# Patient Record
Sex: Female | Born: 1970 | Race: White | Hispanic: No | Marital: Married | Smoking: Never smoker
Health system: Southern US, Academic
[De-identification: ages and names within clinical notes are randomized; demographics above are authoritative.]

## PROBLEM LIST (undated history)

## (undated) DIAGNOSIS — K219 Gastro-esophageal reflux disease without esophagitis: Secondary | ICD-10-CM

## (undated) DIAGNOSIS — R51 Headache: Secondary | ICD-10-CM

## (undated) DIAGNOSIS — R05 Cough: Secondary | ICD-10-CM

## (undated) DIAGNOSIS — F410 Panic disorder [episodic paroxysmal anxiety] without agoraphobia: Secondary | ICD-10-CM

## (undated) DIAGNOSIS — R059 Cough, unspecified: Secondary | ICD-10-CM

## (undated) DIAGNOSIS — R519 Headache, unspecified: Secondary | ICD-10-CM

## (undated) DIAGNOSIS — R062 Wheezing: Secondary | ICD-10-CM

## (undated) DIAGNOSIS — F329 Major depressive disorder, single episode, unspecified: Secondary | ICD-10-CM

## (undated) DIAGNOSIS — F32A Depression, unspecified: Secondary | ICD-10-CM

## (undated) DIAGNOSIS — R002 Palpitations: Secondary | ICD-10-CM

## (undated) HISTORY — PX: HX HYSTERECTOMY: SHX81

## (undated) HISTORY — PX: NO PAST SURGERIES: SHX2092

---

## 2011-04-02 ENCOUNTER — Ambulatory Visit: Payer: Self-pay | Admitting: Internal Medicine

## 2014-05-21 ENCOUNTER — Ambulatory Visit: Payer: Self-pay | Admitting: Obstetrics and Gynecology

## 2014-11-08 ENCOUNTER — Ambulatory Visit: Payer: Self-pay

## 2015-04-08 ENCOUNTER — Ambulatory Visit
Admit: 2015-04-08 | Disposition: A | Discharge status: Home / Self Care | Payer: Self-pay | Attending: Family Medicine | Admitting: Family Medicine

## 2015-10-08 ENCOUNTER — Other Ambulatory Visit: Payer: Self-pay | Admitting: Obstetrics and Gynecology

## 2015-10-08 DIAGNOSIS — Z1231 Encounter for screening mammogram for malignant neoplasm of breast: Secondary | ICD-10-CM

## 2015-12-17 ENCOUNTER — Ambulatory Visit
Admission: RE | Admit: 2015-12-17 | Discharge: 2015-12-17 | Disposition: A | Discharge status: Home / Self Care | Payer: No Typology Code available for payment source | Source: Ambulatory Visit | Attending: Obstetrics and Gynecology | Admitting: Obstetrics and Gynecology

## 2015-12-17 DIAGNOSIS — Z1231 Encounter for screening mammogram for malignant neoplasm of breast: Secondary | ICD-10-CM | POA: Diagnosis present

## 2016-11-25 ENCOUNTER — Other Ambulatory Visit: Payer: Self-pay | Admitting: Family Medicine

## 2018-01-12 ENCOUNTER — Other Ambulatory Visit: Payer: Self-pay | Admitting: Family Medicine

## 2018-01-18 ENCOUNTER — Other Ambulatory Visit: Payer: Self-pay | Admitting: Family Medicine

## 2018-01-18 DIAGNOSIS — Z1239 Encounter for other screening for malignant neoplasm of breast: Secondary | ICD-10-CM

## 2018-02-17 ENCOUNTER — Ambulatory Visit: Payer: No Typology Code available for payment source

## 2018-02-21 ENCOUNTER — Ambulatory Visit
Admission: RE | Admit: 2018-02-21 | Discharge: 2018-02-21 | Disposition: A | Discharge status: Home / Self Care | Payer: 59 | Source: Ambulatory Visit | Attending: Family Medicine | Admitting: Family Medicine

## 2018-02-21 DIAGNOSIS — Z1231 Encounter for screening mammogram for malignant neoplasm of breast: Secondary | ICD-10-CM | POA: Insufficient documentation

## 2018-02-21 DIAGNOSIS — Z1239 Encounter for other screening for malignant neoplasm of breast: Secondary | ICD-10-CM

## 2018-04-28 ENCOUNTER — Other Ambulatory Visit: Payer: Self-pay

## 2018-04-28 ENCOUNTER — Encounter
Admission: RE | Admit: 2018-04-28 | Discharge: 2018-04-28 | Disposition: A | Discharge status: Home / Self Care | Payer: 59 | Source: Ambulatory Visit | Attending: Obstetrics and Gynecology | Admitting: Obstetrics and Gynecology

## 2018-04-28 DIAGNOSIS — R002 Palpitations: Secondary | ICD-10-CM | POA: Diagnosis not present

## 2018-04-28 DIAGNOSIS — Z01810 Encounter for preprocedural cardiovascular examination: Secondary | ICD-10-CM | POA: Insufficient documentation

## 2018-04-28 DIAGNOSIS — Z01812 Encounter for preprocedural laboratory examination: Secondary | ICD-10-CM | POA: Insufficient documentation

## 2018-04-28 HISTORY — DX: Palpitations: R00.2

## 2018-04-28 HISTORY — DX: Cough, unspecified: R05.9

## 2018-04-28 HISTORY — DX: Headache: R51

## 2018-04-28 HISTORY — DX: Depression, unspecified: F32.A

## 2018-04-28 HISTORY — DX: Major depressive disorder, single episode, unspecified: F32.9

## 2018-04-28 HISTORY — DX: Cough: R05

## 2018-04-28 HISTORY — DX: Headache, unspecified: R51.9

## 2018-04-28 HISTORY — DX: Gastro-esophageal reflux disease without esophagitis: K21.9

## 2018-04-28 HISTORY — DX: Panic disorder (episodic paroxysmal anxiety): F41.0

## 2018-04-28 HISTORY — DX: Wheezing: R06.2

## 2018-04-28 LAB — BASIC METABOLIC PANEL
ANION GAP: 7 (ref 5–15)
BUN: 13 mg/dL (ref 6–20)
CHLORIDE: 104 mmol/L (ref 101–111)
CO2: 24 mmol/L (ref 22–32)
CREATININE: 0.88 mg/dL (ref 0.44–1.00)
Calcium: 8.9 mg/dL (ref 8.9–10.3)
GFR calc non Af Amer: >60 mL/min (ref >60)
Glucose, Bld: 98 mg/dL (ref 65–99)
Potassium: 4.1 mmol/L (ref 3.5–5.1)
SODIUM: 135 mmol/L (ref 135–145)

## 2018-04-28 LAB — CBC
HEMATOCRIT: 40.3 % (ref 35.0–47.0)
HEMOGLOBIN: 13.6 g/dL (ref 12.0–16.0)
MCH: 30.3 pg (ref 26.0–34.0)
MCHC: 33.8 g/dL (ref 32.0–36.0)
MCV: 89.9 fL (ref 80.0–100.0)
Platelets: 342 10*3/uL (ref 150–440)
RBC: 4.48 MIL/uL (ref 3.80–5.20)
RDW: 13.3 % (ref 11.5–14.5)
WBC: 5.5 10*3/uL (ref 3.6–11.0)

## 2018-04-28 LAB — TYPE AND SCREEN
ABO/RH(D): O POS
Antibody Screen: NEGATIVE

## 2018-04-28 NOTE — Patient Instructions (Signed)
Your procedure is scheduled on: 05/06/18 Report to Day Surgery. [facility]OND [facility]LOOR To find out your arrival time please call [phone] between 1PM - 3PM on  05/05/18.  Remember: Instructions that are not followed completely may result in serious medical risk, up to and including death, or upon the discretion of your surgeon and anesthesiologist your surgery may need to be rescheduled.     _X__ 1. Do not eat food after midnight the night before your procedure.                 No gum chewing or hard candies. You may drink clear liquids up to 2 hours                 before you are scheduled to arrive for your surgery- DO not drink clear                 liquids within 2 hours of the start of your surgery.                 Clear Liquids include:  water, apple juice without pulp, clear carbohydrate                 drink such as Clearfast of Gartorade, Black Coffee or Tea (Do not add                 anything to coffee or tea).  __X__2.  On the morning of surgery brush your teeth with toothpaste and water, you                 may rinse your mouth with mouthwash if you wish.  Do not swallow any              toothpaste of mouthwash.     _X__ 3.  No Alcohol for 24 hours before or after surgery.   _X__ 4.  Do Not Smoke or use e-cigarettes For 24 Hours Prior to Your Surgery.                 Do not use any chewable tobacco products for at least 6 hours prior to                 surgery.  ____  5.  Bring all medications with you on the day of surgery if instructed.   _X__  6.  Notify your doctor if there is any change in your medical condition      (cold, fever, infections).     Do not wear jewelry, make-up, hairpins, clips or nail polish. Do not wear lotions, powders, or perfumes. You may wear deodorant. Do not shave 48 hours prior to surgery. Men may shave face and neck. Do not bring valuables to the hospital.    [facility] is not responsible for any belongings or  valuables.  Contacts, dentures or bridgework may not be worn into surgery. Leave your suitcase in the car. After surgery it may be brought to your room. For patients admitted to the hospital, discharge time is determined by your treatment team.   Patients discharged the day of surgery will not be allowed to drive home.   Please read over the following fact sheets that you were given:   Surgical Site Infection Prevention   ____ Take these medicines the morning of surgery with A SIP OF WATER:    1. NONE  2.   3.   4.  5.  6.  ____  Fleet Enema (as directed)   _X___ Use CHG Soap as directed  ____ Use inhalers on the day of surgery  ____ Stop metformin 2 days prior to surgery    ____ Take 1/2 of usual insulin dose the night before surgery. No insulin the morning          of surgery.   ____ Stop Coumadin/Plavix/aspirin on   ____ Stop Anti-inflammatories on    ____ Stop supplements until after surgery.    ____ Bring C-Pap to the hospital.

## 2018-04-28 NOTE — Pre-Procedure Instructions (Signed)
NONPRODUCTIVE COUGH /WHEEZING X 5 DAYS. ASKED HER TO BE SEEN AT AN URGENT CARE IF NO PCP AND DR BEASLEY DID NOT GIVE INHALER. PATIENT UNDERSTOOD SHE MIFHT

## 2018-04-28 NOTE — H&P (Signed)
Patient ID: Mercedes Rollins is a 47 y.o. female presenting with Pre Op Consulting  on 03/30/2018  HPI:  Surgical consult for 10cm fibroids with heavy periods and back pain. Urinary hesitation occasionally.  Hx of LEEP in the remote past  Nulliparous, interested in hysterectomy  Last pap smear 2019 neg with neg HPV Pap 2015 neg with neg HPV  - Hx of HTN - Hx of Body mass index is 37.84 kg/m.  Past Medical History:  has a past medical history of Chickenpox, Depression, unspecified, Fibroid, and GERD (gastroesophageal reflux disease).  Past Surgical History:  has a past surgical history that includes Cervical biopsy w/ loop electrode excision and photorefractive keratotomy/lasik (Bilateral). Family History: family history includes Alcohol abuse in her father; Breast cancer in her maternal grandmother; Depression in her mother; High blood pressure (Hypertension) in her mother; Obesity in her mother. Social History:  reports that she has never smoked. She has never used smokeless tobacco. She reports that she drinks about 6.0 oz of alcohol per week. She reports that she does not use drugs. OB/GYN History:          OB History    Gravida  0   Para  0   Term  0   Preterm  0   AB  0   Living  0     SAB  0   TAB  0   Ectopic  0   Molar      Multiple  0   Live Births             Allergies: has No Known Allergies. Medications:  Current Outpatient Medications:  .  ASCORBATE CALCIUM (VITAMIN C ORAL), Take by mouth., Disp: , Rfl:  .  betamethasone dipropionate (DIPROLENE) 0.05 % cream, Apply topically 2 (two) times daily., Disp: 45 g, Rfl: 1 .  calcium carbonate-vitamin D3 (CALTRATE 600+D) 600 mg(1,500mg ) -200 unit tablet, Take 1 tablet by mouth 2 (two) times daily with meals., Disp: , Rfl:  .  CINNAMON BARK (CINNAMON ORAL), Take by mouth., Disp: , Rfl:  .  CRANBERRY EXTRACT (CRANBERRY ORAL), Take by mouth., Disp: , Rfl:  .  cyanocobalamin (VITAMIN  B12) 1000 MCG tablet, Take 1,000 mcg by mouth once daily., Disp: , Rfl:  .  ketoconazole (NIZORAL) 2 % cream, Apply topically 2 (two) times daily. Apply under skin folds. (Patient not taking: Reported on 03/16/2018 ), Disp: 15 g, Rfl: 0 .  multivitamin tablet, Take 1 tablet by mouth once daily., Disp: , Rfl:  .  omega-3 fatty acids/fish oil 340-1,000 mg capsule, Take 1 capsule by mouth 2 (two) times daily., Disp: , Rfl:  .  predniSONE (DELTASONE) 10 MG tablet, 4 tablets on days 1-2, 2 tablets on days 3-4, 1 tablet on days 5-6 (Patient not taking: Reported on 03/16/2018 ), Disp: 14 tablet, Rfl: 0 .  sertraline (ZOLOFT) 100 MG tablet, Take 1.5 tablets (150 mg total) by mouth once daily, Disp: 45 tablet, Rfl: 11 .  SUMAtriptan (IMITREX) 100 MG tablet, Take 1/2 to 1 tablet at headache onset. May take a second dose after 2 hours if needed., Disp: 9 tablet, Rfl: 3 .  triamcinolone 0.1 % ointment, Apply topically 2 (two) times daily. For irritated/itchy skin. (Patient not taking: Reported on 03/16/2018 ), Disp: 30 g, Rfl: 0 .  turmeric root extract 538 mg Cap, Take by mouth., Disp: , Rfl:  .  VITAMIN B COMPLEX (B COMPLEX 1 ORAL), Take by mouth., Disp: , Rfl:  Review of Systems: No SOB, no palpitations or chest pain, no new lower extremity edema, no nausea or vomiting or bowel or bladder complaints. See HPI for gyn specific ROS.   Exam:   BP (!) 158/94   Ht 160 cm (5\' 3" )   Wt 96.3 kg (212 lb 3.2 oz)   LMP 03/23/2018 (Exact Date)   BMI 37.59 kg/m   General: Patient is well-groomed, well-nourished, appears stated age in no acute distress  HEENT: head is atraumatic and normocephalic, trachea is midline, neck is supple with no palpable nodules  CV: Regular rhythm and normal heart rate, no murmur  Pulm: Clear to auscultation throughout lung fields with no wheezing, crackles, or rhonchi. No increased work of breathing  Abdomen: soft , no mass, non-tender, no rebound tenderness, no  hepatomegaly  Pelvic: tanner stage 5 ,              External genitalia: vulva /labia no lesions             Urethra: no prolapse             Vagina: normal physiologic d/c, laxity in vaginal walls             Cervix: no lesions, no cervical motion tenderness, good descent             Uterus: 16wk sized, both broad and long, regular contour, non-tender             Adnexa: no mass,  non-tender               Rectovaginal: External wnl     Endometrial biopsy: The cervix was cleaned with betadine, topical Hurriciane spray applied, and a single tooth tenaculum is applied to the anterior cervix. The Pipelle catheter was placed into the endometrial cavity. It sounds to 9 cm and adequate tissue was removed.     Impression:   The primary encounter diagnosis was Intramural leiomyoma of uterus. A diagnosis of Excessive or frequent menstruation was also pertinent to this visit.    Plan:   Patient returns for a preoperative discussion regarding her plans to proceed with definitive surgical treatment of her fibroids by total laproscopic hysterectomy with bilateral salpingectomy and cystoscopy.  The patient and I discussed the technical aspects of the procedure including the potential for risks and complications. These include but are not limited to the risk of infection requiring post-operative antibiotics or further procedures. We talked about the risk of injury to adjacent organs including bladder, bowel, ureter, blood vessels or nerves. We talked about the need to convert to a laparoscopy or an open incision. We talked about the possible need for blood transfusion. We talked aboutpostop complications such asthromboembolic or cardiopulmonary complications. All of her questions were answered.  Her preoperative exam was completed and the appropriate consents were signed. She is scheduled to undergo this procedure in the near future.  Her uterus is broad and fills the pelvis. I have  given her a 30% chance of converting to a TAH. She will need morcellation vaginally or with the Excite technique.   Return for Postop check 2 weeks after surgery.  Sherrie George, MD

## 2018-05-05 MED ORDER — CEFAZOLIN SODIUM-DEXTROSE 2-4 GM/100ML-% IV SOLN
2.0000 g | INTRAVENOUS | Status: AC
  Administered 2018-05-06 (×2): 2 g via INTRAVENOUS

## 2018-05-06 ENCOUNTER — Ambulatory Visit: Payer: 59 | Admitting: Certified Registered Nurse Anesthetist

## 2018-05-06 ENCOUNTER — Encounter
Admission: RE | Disposition: A | Discharge status: Home / Self Care | Payer: Self-pay | Source: Ambulatory Visit | Attending: Obstetrics and Gynecology

## 2018-05-06 ENCOUNTER — Encounter: Payer: Self-pay | Admitting: *Deleted

## 2018-05-06 ENCOUNTER — Observation Stay
Admission: RE | Admit: 2018-05-06 | Discharge: 2018-05-07 | Disposition: A | Discharge status: Home / Self Care | Payer: 59 | Source: Ambulatory Visit | Attending: Obstetrics and Gynecology | Admitting: Obstetrics and Gynecology

## 2018-05-06 ENCOUNTER — Other Ambulatory Visit: Payer: Self-pay

## 2018-05-06 DIAGNOSIS — Z811 Family history of alcohol abuse and dependence: Secondary | ICD-10-CM | POA: Insufficient documentation

## 2018-05-06 DIAGNOSIS — F329 Major depressive disorder, single episode, unspecified: Secondary | ICD-10-CM | POA: Insufficient documentation

## 2018-05-06 DIAGNOSIS — N838 Other noninflammatory disorders of ovary, fallopian tube and broad ligament: Secondary | ICD-10-CM | POA: Diagnosis not present

## 2018-05-06 DIAGNOSIS — N8301 Follicular cyst of right ovary: Secondary | ICD-10-CM | POA: Diagnosis not present

## 2018-05-06 DIAGNOSIS — Z8249 Family history of ischemic heart disease and other diseases of the circulatory system: Secondary | ICD-10-CM | POA: Insufficient documentation

## 2018-05-06 DIAGNOSIS — D259 Leiomyoma of uterus, unspecified: Secondary | ICD-10-CM | POA: Diagnosis not present

## 2018-05-06 DIAGNOSIS — J45909 Unspecified asthma, uncomplicated: Secondary | ICD-10-CM | POA: Insufficient documentation

## 2018-05-06 DIAGNOSIS — Z818 Family history of other mental and behavioral disorders: Secondary | ICD-10-CM | POA: Diagnosis not present

## 2018-05-06 DIAGNOSIS — Z79899 Other long term (current) drug therapy: Secondary | ICD-10-CM | POA: Insufficient documentation

## 2018-05-06 DIAGNOSIS — N938 Other specified abnormal uterine and vaginal bleeding: Secondary | ICD-10-CM | POA: Insufficient documentation

## 2018-05-06 DIAGNOSIS — Z803 Family history of malignant neoplasm of breast: Secondary | ICD-10-CM | POA: Diagnosis not present

## 2018-05-06 DIAGNOSIS — M549 Dorsalgia, unspecified: Secondary | ICD-10-CM | POA: Insufficient documentation

## 2018-05-06 DIAGNOSIS — N8311 Corpus luteum cyst of right ovary: Secondary | ICD-10-CM | POA: Insufficient documentation

## 2018-05-06 DIAGNOSIS — Z8489 Family history of other specified conditions: Secondary | ICD-10-CM | POA: Diagnosis not present

## 2018-05-06 DIAGNOSIS — K219 Gastro-esophageal reflux disease without esophagitis: Secondary | ICD-10-CM | POA: Insufficient documentation

## 2018-05-06 DIAGNOSIS — N72 Inflammatory disease of cervix uteri: Principal | ICD-10-CM | POA: Insufficient documentation

## 2018-05-06 HISTORY — PX: LAPAROSCOPIC HYSTERECTOMY: SHX1926

## 2018-05-06 HISTORY — PX: CYSTOSCOPY: SHX5120

## 2018-05-06 HISTORY — PX: HYSTERECTOMY ABDOMINAL WITH SALPINGECTOMY: SHX6725

## 2018-05-06 HISTORY — PX: LAPAROSCOPIC BILATERAL SALPINGECTOMY: SHX5889

## 2018-05-06 LAB — POCT PREGNANCY, URINE: PREG TEST UR: NEGATIVE

## 2018-05-06 LAB — ABO/RH: ABO/RH(D): O POS

## 2018-05-06 SURGERY — HYSTERECTOMY, TOTAL, LAPAROSCOPIC
Anesthesia: General

## 2018-05-06 MED ORDER — HYDROMORPHONE HCL 1 MG/ML IJ SOLN
INTRAMUSCULAR | Status: DC | PRN
  Administered 2018-05-06 (×2): 0.5 mg via INTRAVENOUS

## 2018-05-06 MED ORDER — SIMETHICONE 80 MG PO CHEW: 80.0000 mg | CHEWABLE_TABLET | Freq: Four times a day (QID) | ORAL | Status: DC | PRN

## 2018-05-06 MED ORDER — FENTANYL CITRATE (PF) 100 MCG/2ML IJ SOLN
INTRAMUSCULAR | Status: AC
  Filled 2018-05-06: qty 2

## 2018-05-06 MED ORDER — BUPIVACAINE LIPOSOME 1.3 % IJ SUSP
INTRAMUSCULAR | Status: DC | PRN
  Administered 2018-05-06: 20 mL

## 2018-05-06 MED ORDER — FAMOTIDINE 20 MG PO TABS
ORAL_TABLET | ORAL | Status: AC
  Administered 2018-05-06: 20 mg via ORAL
  Filled 2018-05-06: qty 1

## 2018-05-06 MED ORDER — OXYCODONE HCL 5 MG PO TABS
ORAL_TABLET | ORAL | Status: AC
Start: 2018-05-06 — End: 2018-05-06
  Administered 2018-05-06: 5 mg via ORAL
  Filled 2018-05-06: qty 1

## 2018-05-06 MED ORDER — LACTATED RINGERS IV SOLN
INTRAVENOUS | Status: DC
  Administered 2018-05-06: 09:00:00 via INTRAVENOUS

## 2018-05-06 MED ORDER — KETOROLAC TROMETHAMINE 30 MG/ML IJ SOLN
INTRAMUSCULAR | Status: DC | PRN
  Administered 2018-05-06: 30 mg via INTRAVENOUS

## 2018-05-06 MED ORDER — ACETAMINOPHEN 500 MG PO TABS
1000.0000 mg | ORAL_TABLET | ORAL | Status: AC
  Administered 2018-05-06: 1000 mg via ORAL

## 2018-05-06 MED ORDER — ONDANSETRON HCL 4 MG/2ML IJ SOLN
INTRAMUSCULAR | Status: AC
  Filled 2018-05-06: qty 2

## 2018-05-06 MED ORDER — ONDANSETRON HCL 4 MG/2ML IJ SOLN: 4.0000 mg | Freq: Four times a day (QID) | INTRAMUSCULAR | Status: DC | PRN

## 2018-05-06 MED ORDER — SODIUM CHLORIDE 0.9 % IV SOLN: INTRAVENOUS | Status: DC | PRN

## 2018-05-06 MED ORDER — KETOROLAC TROMETHAMINE 30 MG/ML IJ SOLN
30.0000 mg | Freq: Four times a day (QID) | INTRAMUSCULAR | Status: DC
  Filled 2018-05-06: qty 1

## 2018-05-06 MED ORDER — LACTATED RINGERS IV SOLN: INTRAVENOUS | Status: DC

## 2018-05-06 MED ORDER — CEFAZOLIN SODIUM-DEXTROSE 2-4 GM/100ML-% IV SOLN
INTRAVENOUS | Status: AC
  Filled 2018-05-06: qty 100

## 2018-05-06 MED ORDER — SERTRALINE HCL 25 MG PO TABS
150.0000 mg | ORAL_TABLET | Freq: Every day | ORAL | Status: DC
  Filled 2018-05-06: qty 1

## 2018-05-06 MED ORDER — ALUM & MAG HYDROXIDE-SIMETH 200-200-20 MG/5ML PO SUSP: 30.0000 mL | ORAL | Status: DC | PRN

## 2018-05-06 MED ORDER — SUCCINYLCHOLINE CHLORIDE 20 MG/ML IJ SOLN
INTRAMUSCULAR | Status: DC | PRN
  Administered 2018-05-06: 100 mg via INTRAVENOUS

## 2018-05-06 MED ORDER — ACETAMINOPHEN NICU IV SYRINGE 10 MG/ML
INTRAVENOUS | Status: AC
  Filled 2018-05-06: qty 1

## 2018-05-06 MED ORDER — MIDAZOLAM HCL 2 MG/2ML IJ SOLN
INTRAMUSCULAR | Status: DC | PRN
  Administered 2018-05-06: 2 mg via INTRAVENOUS

## 2018-05-06 MED ORDER — BUPIVACAINE HCL 0.5 % IJ SOLN
INTRAMUSCULAR | Status: DC | PRN
  Administered 2018-05-06: 12 mL

## 2018-05-06 MED ORDER — FENTANYL CITRATE (PF) 100 MCG/2ML IJ SOLN
INTRAMUSCULAR | Status: AC
Start: 2018-05-06 — End: ?
  Filled 2018-05-06: qty 2

## 2018-05-06 MED ORDER — KETOROLAC TROMETHAMINE 30 MG/ML IJ SOLN
30.0000 mg | Freq: Four times a day (QID) | INTRAMUSCULAR | Status: DC
  Administered 2018-05-07 (×2): 30 mg via INTRAVENOUS
  Filled 2018-05-06: qty 1

## 2018-05-06 MED ORDER — TRANEXAMIC ACID 1000 MG/10ML IV SOLN
1000.0000 mg | INTRAVENOUS | Status: DC
  Filled 2018-05-06: qty 10

## 2018-05-06 MED ORDER — CEFAZOLIN SODIUM 1 G IJ SOLR
INTRAMUSCULAR | Status: AC
  Filled 2018-05-06: qty 20

## 2018-05-06 MED ORDER — FENTANYL CITRATE (PF) 100 MCG/2ML IJ SOLN: 25.0000 ug | INTRAMUSCULAR | Status: DC | PRN

## 2018-05-06 MED ORDER — SUGAMMADEX SODIUM 200 MG/2ML IV SOLN
INTRAVENOUS | Status: AC
  Filled 2018-05-06: qty 2

## 2018-05-06 MED ORDER — ROCURONIUM BROMIDE 50 MG/5ML IV SOLN
INTRAVENOUS | Status: AC
  Filled 2018-05-06: qty 1

## 2018-05-06 MED ORDER — ONDANSETRON HCL 4 MG PO TABS: 4.0000 mg | ORAL_TABLET | Freq: Four times a day (QID) | ORAL | Status: DC | PRN

## 2018-05-06 MED ORDER — BUPIVACAINE HCL (PF) 0.5 % IJ SOLN
INTRAMUSCULAR | Status: AC
  Filled 2018-05-06: qty 30

## 2018-05-06 MED ORDER — PHENYLEPHRINE HCL 10 MG/ML IJ SOLN
INTRAMUSCULAR | Status: DC | PRN
  Administered 2018-05-06: 150 ug via INTRAVENOUS
  Administered 2018-05-06 (×2): 100 ug via INTRAVENOUS
  Administered 2018-05-06: 200 ug via INTRAVENOUS
  Administered 2018-05-06: 150 ug via INTRAVENOUS
  Administered 2018-05-06: 100 ug via INTRAVENOUS

## 2018-05-06 MED ORDER — HYDROMORPHONE HCL 1 MG/ML IJ SOLN: 0.2000 mg | INTRAMUSCULAR | Status: DC | PRN

## 2018-05-06 MED ORDER — DEXAMETHASONE SODIUM PHOSPHATE 10 MG/ML IJ SOLN
INTRAMUSCULAR | Status: AC
  Filled 2018-05-06: qty 1

## 2018-05-06 MED ORDER — GABAPENTIN 300 MG PO CAPS
ORAL_CAPSULE | ORAL | Status: AC
  Filled 2018-05-06: qty 1

## 2018-05-06 MED ORDER — DOCUSATE SODIUM 100 MG PO CAPS
100.0000 mg | ORAL_CAPSULE | Freq: Two times a day (BID) | ORAL | Status: DC
  Administered 2018-05-06 – 2018-05-07 (×2): 100 mg via ORAL
  Filled 2018-05-06 (×2): qty 1

## 2018-05-06 MED ORDER — BUPIVACAINE LIPOSOME 1.3 % IJ SUSP
INTRAMUSCULAR | Status: AC
  Filled 2018-05-06: qty 20

## 2018-05-06 MED ORDER — FENTANYL CITRATE (PF) 100 MCG/2ML IJ SOLN
INTRAMUSCULAR | Status: DC | PRN
  Administered 2018-05-06: 50 ug via INTRAVENOUS
  Administered 2018-05-06 (×3): 25 ug via INTRAVENOUS
  Administered 2018-05-06: 50 ug via INTRAVENOUS
  Administered 2018-05-06: 25 ug via INTRAVENOUS

## 2018-05-06 MED ORDER — ONDANSETRON HCL 4 MG/2ML IJ SOLN: 4.0000 mg | Freq: Once | INTRAMUSCULAR | Status: DC | PRN

## 2018-05-06 MED ORDER — ONDANSETRON HCL 4 MG/2ML IJ SOLN
INTRAMUSCULAR | Status: DC | PRN
  Administered 2018-05-06 (×2): 4 mg via INTRAVENOUS

## 2018-05-06 MED ORDER — SODIUM CHLORIDE 0.9 % IV SOLN
INTRAVENOUS | Status: DC | PRN
  Administered 2018-05-06: 10 ug/min via INTRAVENOUS

## 2018-05-06 MED ORDER — EPHEDRINE SULFATE 50 MG/ML IJ SOLN
INTRAMUSCULAR | Status: DC | PRN
  Administered 2018-05-06: 10 mg via INTRAVENOUS
  Administered 2018-05-06: 5 mg via INTRAVENOUS
  Administered 2018-05-06 (×2): 10 mg via INTRAVENOUS

## 2018-05-06 MED ORDER — KETOROLAC TROMETHAMINE 30 MG/ML IJ SOLN
INTRAMUSCULAR | Status: AC
  Filled 2018-05-06: qty 1

## 2018-05-06 MED ORDER — KETOROLAC TROMETHAMINE 30 MG/ML IJ SOLN: 30.0000 mg | Freq: Once | INTRAMUSCULAR | Status: DC

## 2018-05-06 MED ORDER — LIDOCAINE HCL (PF) 2 % IJ SOLN
INTRAMUSCULAR | Status: AC
  Filled 2018-05-06: qty 10

## 2018-05-06 MED ORDER — METHYLENE BLUE 0.5 % INJ SOLN
INTRAVENOUS | Status: AC
  Filled 2018-05-06: qty 10

## 2018-05-06 MED ORDER — ALBUTEROL SULFATE (2.5 MG/3ML) 0.083% IN NEBU: 2.5000 mg | INHALATION_SOLUTION | Freq: Four times a day (QID) | RESPIRATORY_TRACT | Status: DC | PRN

## 2018-05-06 MED ORDER — HYDROMORPHONE HCL 1 MG/ML IJ SOLN
INTRAMUSCULAR | Status: AC
  Filled 2018-05-06: qty 1

## 2018-05-06 MED ORDER — ALBUMIN HUMAN 5 % IV SOLN
INTRAVENOUS | Status: AC
  Filled 2018-05-06: qty 250

## 2018-05-06 MED ORDER — GABAPENTIN 300 MG PO CAPS
900.0000 mg | ORAL_CAPSULE | Freq: Every day | ORAL | Status: DC
  Filled 2018-05-06: qty 3

## 2018-05-06 MED ORDER — SUGAMMADEX SODIUM 200 MG/2ML IV SOLN
INTRAVENOUS | Status: DC | PRN
  Administered 2018-05-06: 100 mg via INTRAVENOUS
  Administered 2018-05-06: 200 mg via INTRAVENOUS

## 2018-05-06 MED ORDER — ACETAMINOPHEN 500 MG PO TABS
ORAL_TABLET | ORAL | Status: AC
  Administered 2018-05-06: 1000 mg via ORAL
  Filled 2018-05-06: qty 2

## 2018-05-06 MED ORDER — MENTHOL 3 MG MT LOZG
1.0000 | LOZENGE | OROMUCOSAL | Status: DC | PRN
Start: 2018-05-06 — End: 2018-05-07
  Filled 2018-05-06: qty 9

## 2018-05-06 MED ORDER — GABAPENTIN 300 MG PO CAPS
900.0000 mg | ORAL_CAPSULE | ORAL | Status: AC
  Administered 2018-05-06: 900 mg via ORAL

## 2018-05-06 MED ORDER — PROPOFOL 10 MG/ML IV BOLUS
INTRAVENOUS | Status: DC | PRN
  Administered 2018-05-06: 170 mg via INTRAVENOUS

## 2018-05-06 MED ORDER — STERILE WATER FOR IRRIGATION IR SOLN
Status: DC | PRN
  Administered 2018-05-06: 180 mL via INTRAVESICAL

## 2018-05-06 MED ORDER — FAMOTIDINE 20 MG PO TABS
20.0000 mg | ORAL_TABLET | Freq: Once | ORAL | Status: AC
  Administered 2018-05-06: 20 mg via ORAL

## 2018-05-06 MED ORDER — ALBUMIN HUMAN 5 % IV SOLN
INTRAVENOUS | Status: DC | PRN
  Administered 2018-05-06: 14:00:00 via INTRAVENOUS

## 2018-05-06 MED ORDER — ROCURONIUM BROMIDE 100 MG/10ML IV SOLN
INTRAVENOUS | Status: DC | PRN
  Administered 2018-05-06: 5 mg via INTRAVENOUS
  Administered 2018-05-06 (×2): 20 mg via INTRAVENOUS
  Administered 2018-05-06: 10 mg via INTRAVENOUS
  Administered 2018-05-06: 35 mg via INTRAVENOUS
  Administered 2018-05-06 (×2): 20 mg via INTRAVENOUS
  Administered 2018-05-06 (×2): 10 mg via INTRAVENOUS
  Administered 2018-05-06 (×2): 5 mg via INTRAVENOUS

## 2018-05-06 MED ORDER — SODIUM CHLORIDE 0.9 % IV SOLN
INTRAVENOUS | Status: DC | PRN
  Administered 2018-05-06 (×2): via INTRAVENOUS

## 2018-05-06 MED ORDER — OXYCODONE HCL 5 MG PO TABS
5.0000 mg | ORAL_TABLET | ORAL | Status: DC | PRN
  Administered 2018-05-06 – 2018-05-07 (×3): 5 mg via ORAL
  Filled 2018-05-06 (×2): qty 1

## 2018-05-06 MED ORDER — ACETAMINOPHEN 500 MG PO TABS
1000.0000 mg | ORAL_TABLET | Freq: Four times a day (QID) | ORAL | Status: DC
  Administered 2018-05-06 – 2018-05-07 (×3): 1000 mg via ORAL
  Filled 2018-05-06 (×3): qty 2

## 2018-05-06 MED ORDER — GLYCOPYRROLATE 0.2 MG/ML IJ SOLN
INTRAMUSCULAR | Status: DC | PRN
  Administered 2018-05-06: 0.2 mg via INTRAVENOUS

## 2018-05-06 MED ORDER — PROPOFOL 10 MG/ML IV BOLUS
INTRAVENOUS | Status: AC
  Filled 2018-05-06: qty 20

## 2018-05-06 MED ORDER — DEXAMETHASONE SODIUM PHOSPHATE 10 MG/ML IJ SOLN
INTRAMUSCULAR | Status: DC | PRN
  Administered 2018-05-06: 10 mg via INTRAVENOUS

## 2018-05-06 MED ORDER — LACTATED RINGERS IV SOLN
INTRAVENOUS | Status: DC
  Administered 2018-05-06: 21:00:00 via INTRAVENOUS

## 2018-05-06 MED ORDER — MIDAZOLAM HCL 2 MG/2ML IJ SOLN
INTRAMUSCULAR | Status: AC
  Filled 2018-05-06: qty 2

## 2018-05-06 MED ORDER — SUCCINYLCHOLINE CHLORIDE 20 MG/ML IJ SOLN
INTRAMUSCULAR | Status: AC
  Filled 2018-05-06: qty 1

## 2018-05-06 MED ORDER — LIDOCAINE HCL (CARDIAC) PF 100 MG/5ML IV SOSY
PREFILLED_SYRINGE | INTRAVENOUS | Status: DC | PRN
  Administered 2018-05-06: 100 mg via INTRAVENOUS

## 2018-05-06 MED ORDER — GABAPENTIN 300 MG PO CAPS
ORAL_CAPSULE | ORAL | Status: AC
  Administered 2018-05-06: 900 mg via ORAL
  Filled 2018-05-06: qty 3

## 2018-05-06 MED ORDER — ACETAMINOPHEN 10 MG/ML IV SOLN
INTRAVENOUS | Status: DC | PRN
  Administered 2018-05-06: 1000 mg via INTRAVENOUS

## 2018-05-06 SURGICAL SUPPLY — 71 items
ANCHOR TIS RET SYS 1550ML (BAG) ×4 IMPLANT
BAG URINE DRAINAGE (UROLOGICAL SUPPLIES) ×8 IMPLANT
BLADE SURG 11 STRL SS SAFETY (MISCELLANEOUS) ×28 IMPLANT
BLADE SURG SZ11 CARB STEEL (BLADE) ×4 IMPLANT
CATH FOLEY 2WAY  5CC 16FR (CATHETERS) ×2
CATH ROBINSON RED A/P 16FR (CATHETERS) ×4 IMPLANT
CATH URTH 16FR FL 2W BLN LF (CATHETERS) ×2 IMPLANT
CHLORAPREP W/TINT 26ML (MISCELLANEOUS) ×4 IMPLANT
CLOSURE WOUND 1/4X4 (GAUZE/BANDAGES/DRESSINGS) ×1
CNTNR SPEC 2.5X3XGRAD LEK (MISCELLANEOUS) ×2
CONT SPEC 4OZ STER OR WHT (MISCELLANEOUS) ×2
CONTAINER SPEC 2.5X3XGRAD LEK (MISCELLANEOUS) ×2 IMPLANT
CORD MONOPOLAR M/FML 12FT (MISCELLANEOUS) ×4 IMPLANT
COUNTER NEEDLE 20/40 LG (NEEDLE) ×4 IMPLANT
COVER LIGHT HANDLE STERIS (MISCELLANEOUS) ×8 IMPLANT
DERMABOND ADVANCED (GAUZE/BANDAGES/DRESSINGS) ×2
DERMABOND ADVANCED .7 DNX12 (GAUZE/BANDAGES/DRESSINGS) ×2 IMPLANT
DEVICE SUTURE ENDOST 10MM (ENDOMECHANICALS) ×4 IMPLANT
DRAPE STERI POUCH LG 24X46 STR (DRAPES) ×4 IMPLANT
DRSG TEGADERM 2-3/8X2-3/4 SM (GAUZE/BANDAGES/DRESSINGS) ×12 IMPLANT
GLOVE BIO SURGEON STRL SZ7 (GLOVE) ×12 IMPLANT
GLOVE INDICATOR 7.5 STRL GRN (GLOVE) ×32 IMPLANT
GOWN STRL REUS W/ TWL LRG LVL3 (GOWN DISPOSABLE) ×4 IMPLANT
GOWN STRL REUS W/ TWL XL LVL3 (GOWN DISPOSABLE) ×2 IMPLANT
GOWN STRL REUS W/TWL LRG LVL3 (GOWN DISPOSABLE) ×4
GOWN STRL REUS W/TWL XL LVL3 (GOWN DISPOSABLE) ×2
HANDLE YANKAUER SUCT BULB TIP (MISCELLANEOUS) ×4 IMPLANT
IRRIGATION STRYKERFLOW (MISCELLANEOUS) ×2 IMPLANT
IRRIGATOR STRYKERFLOW (MISCELLANEOUS) ×4
IV LACTATED RINGERS 1000ML (IV SOLUTION) ×4 IMPLANT
IV NS 1000ML (IV SOLUTION) ×2
IV NS 1000ML BAXH (IV SOLUTION) ×2 IMPLANT
KIT PINK PAD W/HEAD ARE REST (MISCELLANEOUS) ×4
KIT PINK PAD W/HEAD ARM REST (MISCELLANEOUS) ×2 IMPLANT
KIT TURNOVER CYSTO (KITS) ×4 IMPLANT
LABEL OR SOLS (LABEL) ×4 IMPLANT
LIGASURE VESSEL 5MM BLUNT TIP (ELECTROSURGICAL) ×8 IMPLANT
MANIPULATOR VCARE LG CRV RETR (MISCELLANEOUS) ×4 IMPLANT
MANIPULATOR VCARE SML CRV RETR (MISCELLANEOUS) IMPLANT
MANIPULATOR VCARE STD CRV RETR (MISCELLANEOUS) IMPLANT
NS IRRIG 500ML POUR BTL (IV SOLUTION) ×4 IMPLANT
OCCLUDER COLPOPNEUMO (BALLOONS) ×4 IMPLANT
PACK GYN LAPAROSCOPIC (MISCELLANEOUS) ×4 IMPLANT
PAD OB MATERNITY 4.3X12.25 (PERSONAL CARE ITEMS) ×4 IMPLANT
PAD PREP 24X41 OB/GYN DISP (PERSONAL CARE ITEMS) ×4 IMPLANT
POUCH SPECIMEN RETRIEVAL 10MM (ENDOMECHANICALS) IMPLANT
RETRACTOR WOUND ALXS 18CM SML (MISCELLANEOUS) ×2 IMPLANT
RTRCTR WOUND ALEXIS O 18CM SML (MISCELLANEOUS) ×4
SCISSORS METZENBAUM CVD 33 (INSTRUMENTS) IMPLANT
SET CYSTO W/LG BORE CLAMP LF (SET/KITS/TRAYS/PACK) ×4 IMPLANT
SLEEVE ENDOPATH XCEL 5M (ENDOMECHANICALS) ×4 IMPLANT
SPONGE GAUZE 2X2 8PLY STER LF (GAUZE/BANDAGES/DRESSINGS) ×2
SPONGE GAUZE 2X2 8PLY STRL LF (GAUZE/BANDAGES/DRESSINGS) ×6 IMPLANT
SPONGE LAP 18X18 5 PK (GAUZE/BANDAGES/DRESSINGS) ×4 IMPLANT
STRIP CLOSURE SKIN 1/4X4 (GAUZE/BANDAGES/DRESSINGS) ×3 IMPLANT
SURGILUBE 2OZ TUBE FLIPTOP (MISCELLANEOUS) ×4 IMPLANT
SUT ENDO VLOC 180-0-8IN (SUTURE) ×4 IMPLANT
SUT MNCRL 4-0 (SUTURE) ×2
SUT MNCRL 4-0 27XMFL (SUTURE) ×2
SUT MNCRL AB 4-0 PS2 18 (SUTURE) IMPLANT
SUT VIC AB 0 CT1 36 (SUTURE) ×4 IMPLANT
SUT VIC AB 2-0 UR6 27 (SUTURE) ×4 IMPLANT
SUT VIC AB 4-0 SH 27 (SUTURE)
SUT VIC AB 4-0 SH 27XANBCTRL (SUTURE) IMPLANT
SUTURE MNCRL 4-0 27XMF (SUTURE) ×2 IMPLANT
SYR 10ML LL (SYRINGE) ×4 IMPLANT
SYR 50ML LL SCALE MARK (SYRINGE) ×4 IMPLANT
TROCAR ENDO BLADELESS 11MM (ENDOMECHANICALS) ×4 IMPLANT
TROCAR XCEL NON-BLD 5MMX100MML (ENDOMECHANICALS) ×4 IMPLANT
TUBING INSUF HEATED (TUBING) ×4 IMPLANT
TUBING INSUFFLATION (TUBING) ×4 IMPLANT

## 2018-05-06 NOTE — Op Note (Signed)
Mercedes Rollins PROCEDURE DATE: 05/06/2018  PREOPERATIVE DIAGNOSIS: Abnormal uterine bleeding with enlarged uterus with fibroids POSTOPERATIVE DIAGNOSIS: The same PROCEDURE: Total laparoscopic hysterectomy, bilateral salpingectomy, right oopherectomy, cystoscopy SURGEON:  Dr. [name] ASSISTANT: Dr. Vikki Ports Ward Anesthesiologist:  Anesthesiologist: Molli Barrows, MD; Gunnar Fusi, MD CRNA: Clinton Sawyer, CRNA; Nelda Marseille, CRNA; Jonna Clark, CRNA Student Nurse Anesthetist: Wess Botts, RN  INDICATIONS: 47 y.o. F here for definitive surgical management secondary to the indications listed under preoperative diagnoses; please see preoperative note for further details.  Risks of surgery were discussed with the patient including but not limited to: bleeding which may require transfusion or reoperation; infection which may require antibiotics; injury to bowel, bladder, ureters or other surrounding organs; need for additional procedures; thromboembolic phenomenon, incisional problems and other postoperative/anesthesia complications. Written informed consent was obtained.    FINDINGS:  Enlarged uterus to 16 weeks, filling the pelvis broadly with fibroids in the right broad ligament, requiring right oophorectomy to safely round this angle and control bleeding. The right ureter was dissected out and followed through the surgery.  ANESTHESIA:    General INTRAVENOUS FLUIDS: 3000  ml ESTIMATED BLOOD LOSS:300 ml URINE OUTPUT: 300 ml   SPECIMENS: Uterus, cervix, bilateral fallopian tubes and right ovary COMPLICATIONS: None immediate  PROCEDURE IN DETAIL:  The patient received prophalactic intravenous antibiotics and had sequential compression devices applied to her lower extremities while in the preoperative area.  She was then taken to the operating room where general anesthesia was administered and was found to be adequate.  She was placed in the dorsal lithotomy  position, and was prepped and draped in a sterile manner.  A formal time out was performed with all team members present and in agreement.  A V-care uterine manipulator was placed at this time.  A Foley catheter was inserted into her bladder and attached to constant drainage. Attention was turned to the abdomen and 0.5% Marcaine infused subq. A 27OZ umbilical incision was made with the scalpel.  The Optiview trocar and sleeve were then advanced without difficulty with the laparoscope under direct visualization into the abdomen.  The abdomen was then insufflated with carbon dioxide gas and adequate pneumoperitoneum was obtained.  A survey of the patient's pelvis and abdomen revealed the findings above.  Bilateral lower quadrant ports (5 mm on the right and 12 mm on the left) were then placed under direct visualization.  The pelvis was then carefully examined.  Attention was turned to the fallopian tubes; these were freed from the underlying mesosalpinx and the uterine attachments using the Ligasure device. The right ovary was dissected out and doubly ligated, after clearly removing the ureter from the field. The bilateral round and broad ligaments were then clamped and transected with the Ligasure device.  The uterine artery was then skeletonized and a bladder flap was created.  The ureters were noted to be safely away from the area of dissection, with particular attention paid on the right side to the ureter.  The bladder was then bluntly dissected off the lower uterine segment, using backfilling to be certain of the bladder limits.  At this point, attention was turned to the uterine vessels, which were clamped and cauterized using the Ligasure on the left, and then the right. After the uterine blood flow at the level of the internal os was controlled, both arteries were cut with the Ligasure.  Good hemostasis was noted overall.  The uterosacral and cardinal ligaments were clamped, cut and ligated bilaterally  .  Attention was then turned to the cervicovaginal junction, and monopolar scissors were used to transect the cervix from the surrounding vagina using the ring of the V-care as a guide. This was done circumferentially allowing total hysterectomy.  A 5mm surgical bag was placed vaginally and the uterus was scooped into it. The cuff was closed from above with Endostich, and the opening of the bag was brought to the umbilcal port. The skin and fascia here was extended and the bag brought through. A small Alexis was placed to allow for visualization, and 2.5 hrs of morcellation was used by hand in the bag to remove the uterus. Overall excellent hemostasis was noted.    Attention was returned to the abdomen.The ureters were reexamined bilaterally and were pulsating normally. The abdominal pressure was reduced and hemostasis was confirmed.  Cystoscopy showed bilateral ureteral jets.  The two fascial incisions >108mm were closed with 0-vicryl. No stitches were visualized in the bladder during cystoscopy.  All trocars were removed under direct visualization, and the abdomen was desufflated.  All skin incisions were closed with 4-0 Vicryl subcuticular stitches and Dermabond. Exparel was used at all three port sites.   The patient tolerated the procedures well.  All instruments, needles, and sponge counts were correct x 2. The patient was taken to the recovery room awake, extubated and in stable condition.

## 2018-05-06 NOTE — Discharge Instructions (Signed)
Discharge instructions after   total laparoscopic hysterectomy   For the next three days, take ibuprofen and acetaminophen on a schedule, every 8 hours. You can take them together or you can intersperse them, and take one every four hours. I also gave you gabapentin for nighttime, to help you sleep and also to control pain. Take gabapentin medicines at night for at least the next 3 nights. You also have a narcotic, oxycodone, to take as needed if the above medicines don't help.  Postop constipation is a major cause of pain. Stay well hydrated, walk as you tolerate, and take over the counter senna as well as stool softeners if you need them.    Signs and Symptoms to Report Call our office at [phone] if you have any of the following.  . Fever over 100.4 degrees or higher . Severe stomach pain not relieved with pain medications . Bright red bleeding that's heavier than a period that does not slow with rest . To go the bathroom a lot (frequency), you can't hold your urine (urgency), or it hurts when you empty your bladder (urinate) . Chest pain . Shortness of breath . Pain in the calves of your legs . Severe nausea and vomiting not relieved with anti-nausea medications . Signs of infection around your wounds, such as redness, hot to touch, swelling, green/yellow drainage (like pus), bad smelling discharge . Any concerns  What You Can Expect after Surgery . You may see some pink tinged, bloody fluid and bruising around the wound. This is normal. . You may notice shoulder and neck pain. This is caused by the gas used during surgery to expand your abdomen so your surgeon could get to the uterus easier. . You may have a sore throat because of the tube in your mouth during general anesthesia. This will go away in 2 to 3 days. . You may have some stomach cramps. . You may notice spotting on your panties. . You may have pain around the incision sites.   Activities after Your  Discharge Follow these guidelines to help speed your recovery at home: . Do the coughing and deep breathing as you did in the hospital for 2 weeks. Use the small blue breathing device, called the incentive spirometer for 2 weeks. . Don't drive if you are in pain or taking narcotic pain medicine. You may drive when you can safely slam on the brakes, turn the wheel forcefully, and rotate your torso comfortably. This is typically 1-2 weeks. Practice in a parking lot or side street prior to attempting to drive regularly.  . Ask others to help with household chores for 4 weeks. . Do not lift anything heavier that 10 pounds for 4-6 weeks. This includes pets, children, and groceries. . Don't do strenuous activities, exercises, or sports like vacuuming, tennis, squash, etc. until your doctor says it is safe to do so. ---Maintain pelvic rest for 8 weeks. This means nothing in the vagina or rectum at all (no douching, tampons, intercourse) for 8 weeks.  . Walk as you feel able. Rest often since it may take two or three weeks for your energy level to return to normal.  . You may climb stairs . Avoid constipation:   -Eat fruits, vegetables, and whole grains. Eat small meals as your appetite will take time to return to normal.   -Drink 6 to 8 glasses of water each day unless your doctor has told you to limit your fluids.   -Use a laxative   or stool softener as needed if constipation becomes a problem. You may take Miralax, metamucil, Citrucil, Colace, Senekot, FiberCon, etc. If this does not relieve the constipation, try two tablespoons of Milk Of Magnesia every 8 hours until your bowels move.  °• You may shower. Gently wash the wounds with a mild soap and water. Pat dry. °• Do not get in a hot tub, swimming pool, etc. for 6 weeks. °• Do not use lotions, oils, powders on the wounds. °• Do not douche, use tampons, or have sex until your doctor says it is okay. °• Take your pain medicine when you need it. The medicine  may not work as well if the pain is bad. ° °Take the medicines you were taking before surgery. Other medications you will need are pain medications and possibly constipation and nausea medications (Zofran).  ° °

## 2018-05-06 NOTE — Anesthesia Post-op Follow-up Note (Signed)
Anesthesia QCDR form completed.        

## 2018-05-06 NOTE — Progress Notes (Signed)
Was contacted by PACU RN with request to speak with family about communication with family while patient was in surgery

## 2018-05-06 NOTE — Anesthesia Procedure Notes (Signed)
Procedure Name: Intubation Date/Time: 05/06/2018 11:52 AM Performed by: Jonna Clark, CRNA Pre-anesthesia Checklist: Patient identified, Patient being monitored, Timeout performed, Emergency Drugs available and Suction available Patient Re-evaluated:Patient Re-evaluated prior to induction Oxygen Delivery Method: Circle system utilized Preoxygenation: Pre-oxygenation with 100% oxygen Induction Type: IV induction Ventilation: Mask ventilation without difficulty Laryngoscope Size: Mac and 4 Grade View: Grade I Tube type: Oral Tube size: 7.0 mm Number of attempts: 2 Airway Equipment and Method: Stylet Placement Confirmation: ETT inserted through vocal cords under direct vision,  positive ETCO2 and breath sounds checked- equal and bilateral Secured at: 22 cm Tube secured with: Tape Dental Injury: Teeth and Oropharynx as per pre-operative assessment

## 2018-05-06 NOTE — Progress Notes (Signed)
Patient had just returned to room from PACU. Husband present.Pt said it was okay to speak with husband.  Apologized for delay in speaking with husband.Concern related to lack of communication during the operative period about patient's condition. Discussed issues. Stated felt better after speaking, main concern was this not happen to someone else, stated he was "aware things just happen". Told them if other issues happened during the stay to contact the [facility] if they needed additional assistance

## 2018-05-06 NOTE — Interval H&P Note (Signed)
History and Physical Interval Note:  05/06/2018 11:36 AM  Mercedes Rollins  has presented today for surgery, with the diagnosis of abnormal uterine bleeding, fibroids  The various methods of treatment have been discussed with the patient and family. After consideration of risks, benefits and other options for treatment, the patient has consented to  Procedure(s): HYSTERECTOMY TOTAL LAPAROSCOPIC (N/A) LAPAROSCOPIC BILATERAL SALPINGECTOMY (Bilateral) CYSTOSCOPY (N/A) HYSTERECTOMY ABDOMINAL WITH SALPINGECTOMY (Bilateral) as a surgical intervention .  The patient's history has been reviewed, patient examined, no change in status, stable for surgery.  I have reviewed the patient's chart and labs.  Questions were answered to the patient's satisfaction.     [name]

## 2018-05-06 NOTE — Transfer of Care (Signed)
Immediate Anesthesia Transfer of Care Note  Patient: Mercedes Rollins  Procedure(s) Performed: HYSTERECTOMY TOTAL LAPAROSCOPIC (N/A ) LAPAROSCOPIC BILATERAL SALPINGECTOMY (Bilateral ) CYSTOSCOPY (N/A ) HYSTERECTOMY ABDOMINAL WITH SALPINGECTOMY (Bilateral )  Patient Location: PACU  Anesthesia Type:General  Level of Consciousness: sedated  Airway & Oxygen Therapy: Patient Spontanous Breathing and Patient connected to face mask oxygen  Post-op Assessment: Report given to RN and Post -op Vital signs reviewed and stable  Post vital signs: Reviewed and stable  Last Vitals:  Vitals Value Taken Time  BP 92/52 05/06/2018  6:24 PM  Temp 36.6 C 05/06/2018  6:24 PM  Pulse 95 05/06/2018  6:32 PM  Resp 16 05/06/2018  6:32 PM  SpO2 97 % 05/06/2018  6:32 PM  Vitals shown include unvalidated device data.  Last Pain:  Vitals:   05/06/18 1824  TempSrc:   PainSc: 0-No pain         Complications: No apparent anesthesia complications

## 2018-05-06 NOTE — Anesthesia Preprocedure Evaluation (Signed)
Anesthesia Evaluation  Patient identified by MRN, date of birth, ID band Patient awake    Reviewed: Allergy & Precautions, NPO status , Patient's Chart, lab work & pertinent test results  History of Anesthesia Complications Negative for: history of anesthetic complications  Airway Mallampati: II       Dental   Pulmonary asthma (mild wheezing with allergies) , neg sleep apnea, neg COPD,           Cardiovascular (-) hypertension(-) Past MI and (-) CHF (-) dysrhythmias (-) Valvular Problems/Murmurs     Neuro/Psych Anxiety Depression    GI/Hepatic Neg liver ROS, GERD  Controlled,  Endo/Other  neg diabetes  Renal/GU negative Renal ROS     Musculoskeletal   Abdominal   Peds  Hematology   Anesthesia Other Findings   Reproductive/Obstetrics                             Anesthesia Physical Anesthesia Plan  ASA: II  Anesthesia Plan: General   Post-op Pain Management:    Induction:   PONV Risk Score and Plan: 3 and Dexamethasone, Ondansetron and Midazolam  Airway Management Planned: Oral ETT  Additional Equipment:   Intra-op Plan:   Post-operative Plan:   Informed Consent: I have reviewed the patients History and Physical, chart, labs and discussed the procedure including the risks, benefits and alternatives for the proposed anesthesia with the patient or authorized representative who has indicated his/her understanding and acceptance.     Plan Discussed with:   Anesthesia Plan Comments:         Anesthesia Quick Evaluation

## 2018-05-07 ENCOUNTER — Encounter: Payer: Self-pay | Admitting: Obstetrics and Gynecology

## 2018-05-07 DIAGNOSIS — N72 Inflammatory disease of cervix uteri: Secondary | ICD-10-CM | POA: Diagnosis not present

## 2018-05-07 LAB — BASIC METABOLIC PANEL
Anion gap: 7 (ref 5–15)
BUN: 9 mg/dL (ref 6–20)
CALCIUM: 8.3 mg/dL — AB (ref 8.9–10.3)
CO2: 23 mmol/L (ref 22–32)
CREATININE: 0.76 mg/dL (ref 0.44–1.00)
Chloride: 104 mmol/L (ref 101–111)
Glucose, Bld: 127 mg/dL — ABNORMAL HIGH (ref 65–99)
Potassium: 4.2 mmol/L (ref 3.5–5.1)
SODIUM: 134 mmol/L — AB (ref 135–145)

## 2018-05-07 LAB — CBC
HEMATOCRIT: 34.2 % — AB (ref 35.0–47.0)
Hemoglobin: 11.7 g/dL — ABNORMAL LOW (ref 12.0–16.0)
MCH: 30.7 pg (ref 26.0–34.0)
MCHC: 34.1 g/dL (ref 32.0–36.0)
MCV: 90 fL (ref 80.0–100.0)
PLATELETS: 267 10*3/uL (ref 150–440)
RBC: 3.8 MIL/uL (ref 3.80–5.20)
RDW: 13.4 % (ref 11.5–14.5)
WBC: 11 10*3/uL (ref 3.6–11.0)

## 2018-05-07 MED ORDER — IBUPROFEN 800 MG PO TABS: 800.0000 mg | ORAL_TABLET | Freq: Three times a day (TID) | ORAL | 1 refills | Status: DC | PRN

## 2018-05-07 MED ORDER — GABAPENTIN 800 MG PO TABS: 800.0000 mg | ORAL_TABLET | Freq: Every day | ORAL | 0 refills | Status: AC

## 2018-05-07 MED ORDER — DOCUSATE SODIUM 100 MG PO CAPS: 100.0000 mg | ORAL_CAPSULE | Freq: Two times a day (BID) | ORAL | 0 refills | Status: AC

## 2018-05-07 MED ORDER — ACETAMINOPHEN 500 MG PO TABS: 1000.0000 mg | ORAL_TABLET | Freq: Four times a day (QID) | ORAL | 0 refills | Status: AC

## 2018-05-07 MED ORDER — SODIUM CHLORIDE 0.9% FLUSH: 3.0000 mL | Freq: Three times a day (TID) | INTRAVENOUS | Status: DC

## 2018-05-07 MED ORDER — OXYCODONE HCL 5 MG PO CAPS: 5.0000 mg | ORAL_CAPSULE | Freq: Four times a day (QID) | ORAL | 0 refills | Status: AC | PRN

## 2018-05-07 NOTE — Discharge Summary (Signed)
1 Day Post-Op       Procedure(s): HYSTERECTOMY TOTAL LAPAROSCOPIC (N/A) LAPAROSCOPIC BILATERAL SALPINGECTOMY (Bilateral) CYSTOSCOPY (N/A) HYSTERECTOMY ABDOMINAL WITH SALPINGECTOMY (Bilateral) Subjective: The patient is doing well.  No nausea or vomiting. Pain is adequately controlled.  Objective: Vital signs in last 24 hours: Temp:  [97.8 F (36.6 C)-99 F (37.2 C)] 98 F (36.7 C) (05/18 0746) Pulse Rate:  [85-121] 85 (05/18 0746) Resp:  [8-31] 18 (05/18 0746) BP: (88-129)/(52-78) 121/65 (05/18 0746) SpO2:  [88 %-99 %] 98 % (05/18 0746)  Intake/Output  Intake/Output Summary (Last 24 hours) at 05/07/2018 1034 Last data filed at 05/07/2018 0730 Gross per 24 hour  Intake 3970 ml  Output 2400 ml  Net 1570 ml    Physical Exam:  General: Alert and oriented. CV: RRR Lungs: Clear bilaterally. GI: Soft, Nondistended. Incisions: Clean and dry. Urine: Clear, adequate Extremities: Nontender, no erythema, no edema.  Lab Results: Recent Labs    05/07/18 0424  HGB 11.7*  HCT 34.2*  WBC 11.0  PLT 267                 Results for orders placed or performed during the hospital encounter of 05/06/18 (from the past 24 hour(s))  CBC     Status: Abnormal   Collection Time: 05/07/18  4:24 AM  Result Value Ref Range   WBC 11.0 3.6 - 11.0 K/uL   RBC 3.80 3.80 - 5.20 MIL/uL   Hemoglobin 11.7 (L) 12.0 - 16.0 g/dL   HCT 34.2 (L) 35.0 - 47.0 %   MCV 90.0 80.0 - 100.0 fL   MCH 30.7 26.0 - 34.0 pg   MCHC 34.1 32.0 - 36.0 g/dL   RDW 13.4 11.5 - 14.5 %   Platelets 267 150 - 440 K/uL  Basic metabolic panel     Status: Abnormal   Collection Time: 05/07/18  4:24 AM  Result Value Ref Range   Sodium 134 (L) 135 - 145 mmol/L   Potassium 4.2 3.5 - 5.1 mmol/L   Chloride 104 101 - 111 mmol/L   CO2 23 22 - 32 mmol/L   Glucose, Bld 127 (H) 65 - 99 mg/dL   BUN 9 6 - 20 mg/dL   Creatinine, Ser 0.76 0.44 - 1.00 mg/dL   Calcium 8.3 (L) 8.9 - 10.3 mg/dL   GFR calc non Af Amer >60 >60 mL/min   GFR calc Af Amer >60 >60 mL/min   Anion gap 7 5 - 15    Assessment/Plan: 1 Day Post-Op       Procedure(s): HYSTERECTOMY TOTAL LAPAROSCOPIC (N/A) LAPAROSCOPIC BILATERAL SALPINGECTOMY (Bilateral) CYSTOSCOPY (N/A) HYSTERECTOMY ABDOMINAL WITH SALPINGECTOMY (Bilateral)  1) Ambulate, Incentive spirometry 2) Advance diet as tolerated 5) Discharge home today anticipated    [name], MD   LOS: 0 days   [name] 05/07/2018, 10:34 AM

## 2018-05-07 NOTE — Progress Notes (Signed)
Discharge instructions provided.  Pt and sig other verbalize understanding of all instructions and follow-up care.  Pt discharged to home at 1145 on 05/07/18 via wheelchair by RN. Reed Breech, RN 05/07/2018 12:04 PM

## 2018-05-08 NOTE — Anesthesia Postprocedure Evaluation (Signed)
Anesthesia Post Note  Patient: Mercedes Rollins  Procedure(s) Performed: HYSTERECTOMY TOTAL LAPAROSCOPIC (N/A ) LAPAROSCOPIC BILATERAL SALPINGECTOMY (Bilateral ) CYSTOSCOPY (N/A ) HYSTERECTOMY ABDOMINAL WITH SALPINGECTOMY (Bilateral )  Patient location during evaluation: PACU Anesthesia Type: General Level of consciousness: awake and alert Pain management: pain level controlled Vital Signs Assessment: post-procedure vital signs reviewed and stable Respiratory status: spontaneous breathing, nonlabored ventilation, respiratory function stable and patient connected to nasal cannula oxygen Cardiovascular status: blood pressure returned to baseline and stable Postop Assessment: no apparent nausea or vomiting Anesthetic complications: no     Last Vitals:  Vitals:   05/07/18 0348 05/07/18 0746  BP: 129/78 121/65  Pulse: 100 85  Resp: 18 18  Temp: 37.2 C 36.7 C  SpO2:  98%    Last Pain:  Vitals:   05/07/18 1000  TempSrc:   PainSc: 0-No pain                 Molli Barrows

## 2018-05-11 LAB — SURGICAL PATHOLOGY

## 2020-05-01 ENCOUNTER — Other Ambulatory Visit: Payer: Self-pay | Admitting: Medical Oncology

## 2020-05-01 DIAGNOSIS — Z1231 Encounter for screening mammogram for malignant neoplasm of breast: Secondary | ICD-10-CM

## 2020-05-13 ENCOUNTER — Ambulatory Visit
Admission: RE | Admit: 2020-05-13 | Discharge: 2020-05-13 | Disposition: A | Discharge status: Home / Self Care | Payer: BC Managed Care – PPO | Source: Ambulatory Visit | Attending: Medical Oncology | Admitting: Medical Oncology

## 2020-05-13 ENCOUNTER — Other Ambulatory Visit: Payer: Self-pay

## 2020-05-13 DIAGNOSIS — Z1231 Encounter for screening mammogram for malignant neoplasm of breast: Secondary | ICD-10-CM | POA: Diagnosis not present

## 2020-05-15 ENCOUNTER — Other Ambulatory Visit: Payer: Self-pay | Admitting: Medical Oncology

## 2020-05-15 DIAGNOSIS — N6320 Unspecified lump in the left breast, unspecified quadrant: Secondary | ICD-10-CM

## 2020-05-15 DIAGNOSIS — R928 Other abnormal and inconclusive findings on diagnostic imaging of breast: Secondary | ICD-10-CM

## 2020-05-21 ENCOUNTER — Ambulatory Visit
Admission: RE | Admit: 2020-05-21 | Discharge: 2020-05-21 | Disposition: A | Discharge status: Home / Self Care | Payer: BC Managed Care – PPO | Source: Ambulatory Visit | Attending: Medical Oncology | Admitting: Medical Oncology

## 2020-05-21 DIAGNOSIS — R928 Other abnormal and inconclusive findings on diagnostic imaging of breast: Secondary | ICD-10-CM

## 2020-05-21 DIAGNOSIS — N6320 Unspecified lump in the left breast, unspecified quadrant: Secondary | ICD-10-CM

## 2021-03-03 ENCOUNTER — Other Ambulatory Visit: Payer: Self-pay

## 2021-03-03 ENCOUNTER — Ambulatory Visit
Admission: EM | Admit: 2021-03-03 | Discharge: 2021-03-03 | Disposition: A | Discharge status: Home / Self Care | Payer: BC Managed Care – PPO | Attending: Sports Medicine | Admitting: Sports Medicine

## 2021-03-03 DIAGNOSIS — F419 Anxiety disorder, unspecified: Secondary | ICD-10-CM

## 2021-03-03 DIAGNOSIS — R0602 Shortness of breath: Secondary | ICD-10-CM

## 2021-03-03 DIAGNOSIS — M26609 Unspecified temporomandibular joint disorder, unspecified side: Secondary | ICD-10-CM

## 2021-03-03 DIAGNOSIS — F32A Depression, unspecified: Secondary | ICD-10-CM | POA: Diagnosis not present

## 2021-03-03 DIAGNOSIS — R0789 Other chest pain: Secondary | ICD-10-CM

## 2021-03-03 MED ORDER — SERTRALINE HCL 50 MG PO TABS: 50.0000 mg | ORAL_TABLET | Freq: Every day | ORAL | 0 refills | Status: AC

## 2021-03-03 NOTE — ED Provider Notes (Signed)
[facility]    CSN: [id number] Arrival date & time: 03/03/21  3704      History   Chief Complaint Chief Complaint  Patient presents with  . Chest Pain    HPI Mercedes Rollins is a 50 y.o. female.   Patient is a 50 year old female who presents for evaluation of the above issue.  She reports having a lot of stress in her life and feels overwhelmed at times.  She feels some chest tightness with some palpitation.  She says "it feels like I am having a panic attack".  She also says that she has some chronic shortness of breath especially with exertion.  She bases that on the fact that she has gained a lot of weight recently.  No asthma, COPD, or smoking history.  She reports she has been trying to get into her primary care physician's office but has her appointment canceled twice as her provider has left the practice.  She says she has an appointment at the end of March on the 28th.  This is with [facility].  She works in an office mainly.  She says her job is not overly stressful.  She is distressed with life and some family issues.  She reports she takes sertraline 150 mg a day but she has not taken in the last 3 months because she has not been able to get into her primary care physician's office to get refills.  Regarding her jaw pain it seems to be mostly at the TMJ area.  She has had it now for about 3 days.  Left greater than right.  She has pain opening her mouth.  No accidents trauma to that area.  Does not see a dentist on a regular basis.  She denies any issues with sleep.  She says she does not grind her teeth and she does not snore.  Significant history does not reveal any significant red flag signs or symptoms.     Past Medical History:  Diagnosis Date  . Cough    nonproductive x 5 days  . Depression   . GERD (gastroesophageal reflux disease)    ent said silent reflux  . Headache   . Heart palpitations    with anxiety  . Panic attacks   .  Wheezing    x 5 days    Patient Active Problem List   Diagnosis Date Noted  . Fibroid uterus 05/06/2018    Past Surgical History:  Procedure Laterality Date  . CYSTOSCOPY N/A 05/06/2018   Procedure: CYSTOSCOPY;  Surgeon: [name], MD;  Location: [facility];  Service: Gynecology;  Laterality: N/A;  . HYSTERECTOMY ABDOMINAL WITH SALPINGECTOMY Bilateral 05/06/2018   Procedure: HYSTERECTOMY ABDOMINAL WITH SALPINGECTOMY;  Surgeon: [name], MD;  Location: [facility];  Service: Gynecology;  Laterality: Bilateral;  . LAPAROSCOPIC BILATERAL SALPINGECTOMY Bilateral 05/06/2018   Procedure: LAPAROSCOPIC BILATERAL SALPINGECTOMY;  Surgeon: [name], MD;  Location: [facility];  Service: Gynecology;  Laterality: Bilateral;  . LAPAROSCOPIC HYSTERECTOMY N/A 05/06/2018   Procedure: HYSTERECTOMY TOTAL LAPAROSCOPIC;  Surgeon: [name], MD;  Location: [facility];  Service: Gynecology;  Laterality: N/A;  . NO PAST SURGERIES      OB History   No obstetric history on file.      Home Medications    Prior to Admission medications   Medication Sig Start Date End Date Taking? Authorizing Provider  sertraline (ZOLOFT) 50 MG tablet Take 1 tablet (50 mg total) by mouth daily. 03/03/21  Yes  Verda Cumins, MD  albuterol (PROVENTIL HFA;VENTOLIN HFA) 108 (90 Base) MCG/ACT inhaler Inhale 2 puffs into the lungs every 6 (six) hours as needed for wheezing or shortness of breath.    [provider]  docusate sodium (COLACE) 100 MG capsule Take 1 capsule (100 mg total) by mouth 2 (two) times daily. To keep stools soft 05/07/18   [name], MD  gabapentin (NEURONTIN) 800 MG tablet Take 1 tablet (800 mg total) by mouth at bedtime for 14 days. Take nightly for 3 days, then up to 14 days as needed 05/07/18 05/21/18  [name], MD  oxycodone (OXY-IR) 5 MG capsule Take 1 capsule (5 mg total) by mouth every 6 (six) hours as needed for pain. 05/07/18   [name], MD    Family  History Family History  Problem Relation Age of Onset  . Breast cancer Maternal Aunt        2 great aunts  . Breast cancer Maternal Grandmother 60  . Stroke Mother   . Healthy Father     Social History Social History   Tobacco Use  . Smoking status: Never Smoker  . Smokeless tobacco: Never Used  Vaping Use  . Vaping Use: Never used  Substance Use Topics  . Alcohol use: Yes    Comment: 6-8 beer/week   . Drug use: Never     Allergies   Patient has no known allergies.   Review of Systems Review of Systems  Constitutional: Negative.  Negative for activity change, appetite change, chills, diaphoresis, fatigue and fever.  HENT: Negative.  Negative for facial swelling.   Eyes: Negative.   Respiratory: Positive for chest tightness and shortness of breath. Negative for apnea, cough, choking, wheezing and stridor.   Cardiovascular: Positive for palpitations. Negative for leg swelling.  Gastrointestinal: Negative.   Genitourinary: Negative.   Musculoskeletal: Negative.  Negative for arthralgias, back pain, gait problem, joint swelling, myalgias, neck pain and neck stiffness.  Skin: Negative for color change, pallor, rash and wound.  Neurological: Negative for dizziness, tremors, seizures, syncope, speech difficulty, light-headedness and headaches.  All other systems reviewed and are negative.    Physical Exam Triage Vital Signs ED Triage Vitals  Enc Vitals Group     BP 03/03/21 0832 (!) 138/95     Pulse Rate 03/03/21 0832 81     Resp 03/03/21 0832 20     Temp 03/03/21 0832 98.1 F (36.7 C)     Temp Source 03/03/21 0832 Oral     SpO2 03/03/21 0832 100 %     Weight --      Height --      Head Circumference --      Peak Flow --      Pain Score 03/03/21 0830 4     Pain Loc --      Pain Edu? --      Excl. in [place]? --    No data found.  Updated Vital Signs BP (!) 138/95 (BP Location: Right Arm)   Pulse 81   Temp 98.1 F (36.7 C) (Oral)   Resp 20   LMP 04/20/2018  (Approximate)   SpO2 100%   Visual Acuity Right Eye Distance:   Left Eye Distance:   Bilateral Distance:    Right Eye Near:   Left Eye Near:    Bilateral Near:     Physical Exam Vitals and nursing note reviewed.  Constitutional:      General: She is not in acute distress.  Appearance: She is well-developed. She is not ill-appearing, toxic-appearing or diaphoretic.  HENT:     Head: Normocephalic and atraumatic.  Eyes:     Extraocular Movements: Extraocular movements intact.     Pupils: Pupils are equal, round, and reactive to light.  Cardiovascular:     Rate and Rhythm: Normal rate and regular rhythm.  No extrasystoles are present.    Chest Wall: PMI is not displaced.     Pulses:          Carotid pulses are 2+ on the right side and 2+ on the left side.      Radial pulses are 2+ on the right side and 2+ on the left side.       Dorsalis pedis pulses are 2+ on the right side and 2+ on the left side.       Posterior tibial pulses are 2+ on the right side and 2+ on the left side.     Heart sounds: Normal heart sounds. Heart sounds not distant. No murmur heard.  No systolic murmur is present.  No diastolic murmur is present. No friction rub. No gallop. No S3 or S4 sounds.   Pulmonary:     Effort: Pulmonary effort is normal. No tachypnea or respiratory distress.     Breath sounds: Normal breath sounds. No stridor. No decreased breath sounds, wheezing or rales.  Musculoskeletal:     Cervical back: Normal range of motion and neck supple.  Lymphadenopathy:     Cervical: No cervical adenopathy.  Skin:    General: Skin is warm and dry.     Capillary Refill: Capillary refill takes less than 2 seconds.     Findings: No rash.  Neurological:     General: No focal deficit present.     Mental Status: She is alert and oriented to person, place, and time.      UC Treatments / Results  Labs (all labs ordered are listed, but only abnormal results are displayed) Labs Reviewed - No  data to display  EKG  Twelve-lead EKG obtained in the office today at 8:57 AM shows normal sinus rhythm with a ventricular rate of 75 bpm.  PR interval, QRS duration, QT and QT corrected all within normal limits.  There is no axis deviation.  No ST or T wave changes.  Radiology No results found.  Procedures Procedures (including critical care time)  Medications Ordered in UC Medications - No data to display  Initial Impression / Assessment and Plan / UC Course  I have reviewed the triage vital signs and the nursing notes.  Pertinent labs & imaging results that were available during my care of the patient were reviewed by me and considered in my medical decision making (see chart for details).  Clinical impression: 1.  Chest tightness and palpitations which seem secondary to stress and being overwhelmed with life.  Does not appear to be cardiac in nature. 2.  Anxiety and not on sertraline for 3 months now.  Very tearful during the history and exam. 3.  Bilateral jaw pain left greater than right consistent with TMJ. 4.  Inability to get into primary care office but does have an appointment in 2 weeks.  Treatment plan: 1.  The findings and treatment plan were discussed in detail with the patient.  Patient was in agreement. 2.  Recommended doing an EKG.  Results are above.  No acute ST or T wave changes. 3.  I will go ahead and restart her on the  sertraline but at the initial dose of 50 mg a day.  Gave her 30.  This should tide her over until she gets into her primary care physician.  They can titrate up to her regular dose. 4.  Over-the-counter meds as needed, Tylenol and Motrin for her jaw issues. 5.  Recommended that she get into see a dentist if her jaw pain continues.  She may have some bruxism. 6.  Educational handouts provided. 7.  Encouraged her to make sure that she goes and sees her primary care provider on March 28 to deal with all of her nonurgent issues. 8.  We had a long  discussion in the office about coping mechanisms for her stress.  We will also try to give her some education she can read on her own. 9.  If her symptoms were to worsen in any way including significant shortness of breath then I recommend that she call 911 or go directly to the ER.  She voiced verbal understanding. 10.  Follow-up here as needed.    Final Clinical Impressions(s) / UC Diagnoses   Final diagnoses:  Chest wall pain  Anxiety  Depression, unspecified depression type  TMJ (temporomandibular joint disorder)  SOB (shortness of breath) on exertion     Discharge Instructions     Your EKG test was normal. I restarted your sertraline but at the initial dose of 50 mg a day.  Gave her 30.  This should tide her over until she gets into her primary care physician.  They can titrate up to her regular dose. Over-the-counter meds as needed, Tylenol and Motrin for her jaw issues. Recommended that she get into see a dentist if her jaw pain continues.  She may have some bruxism. Educational handouts provided. Encouraged her to make sure that she goes and sees her primary care provider on March 28 to deal with all of her nonurgent issues. We had a long discussion in the office about coping mechanisms for her stress.  We will also try to give her some education she can read on her own. If her symptoms were to worsen in any way including significant shortness of breath then I recommend that she call 911 or go directly to the ER.  She voiced verbal understanding. Follow-up here as needed.    ED Prescriptions    Medication Sig Dispense Auth. Provider   sertraline (ZOLOFT) 50 MG tablet Take 1 tablet (50 mg total) by mouth daily. 30 tablet Verda Cumins, MD     PDMP not reviewed this encounter.   Verda Cumins, MD 03/03/21 [phone]

## 2021-03-03 NOTE — Discharge Instructions (Addendum)
Your EKG test was normal. I restarted your sertraline but at the initial dose of 50 mg a day.  Gave her 30.  This should tide her over until she gets into her primary care physician.  They can titrate up to her regular dose. Over-the-counter meds as needed, Tylenol and Motrin for her jaw issues. Recommended that she get into see a dentist if her jaw pain continues.  She may have some bruxism. Educational handouts provided. Encouraged her to make sure that she goes and sees her primary care provider on March 28 to deal with all of her nonurgent issues. We had a long discussion in the office about coping mechanisms for her stress.  We will also try to give her some education she can read on her own. If her symptoms were to worsen in any way including significant shortness of breath then I recommend that she call 911 or go directly to the ER.  She voiced verbal understanding. Follow-up here as needed.

## 2021-03-03 NOTE — ED Triage Notes (Signed)
Pt reports multiple c/o and presents anxious.  Primary concern is jaw pain starting 3 days ago.  Mostly L TMJ area but sometimes on R.  Worse with opening mouth.  Also reports chest tightness and SOB x 3 mos esp w activity.   Pt states she ran out of her Sertraline and her chol med three mos ago and needs refill.  (Has scheduled appt with PCP that was cancelled but has not asked them for a refill).    Pt reports concern that she may have been exposed to asbestos approx 2 weeks ago.    Pt also reports problems with feet x 3 mos.  Feels pins and needles as well as sharp pain.

## 2021-06-05 IMAGING — MG MM DIGITAL DIAGNOSTIC UNILAT*L* W/ TOMO W/ CAD
8 series · 9 of 24 positions shown · non-contrast
Comparison: Previous exam(s).

CLINICAL DATA: The patient was called back for a left breast mass.

EXAM:
DIGITAL DIAGNOSTIC LEFT MAMMOGRAM WITH TOMO
ULTRASOUND LEFT BREAST

[L CC synth-2D (1 of 2)]
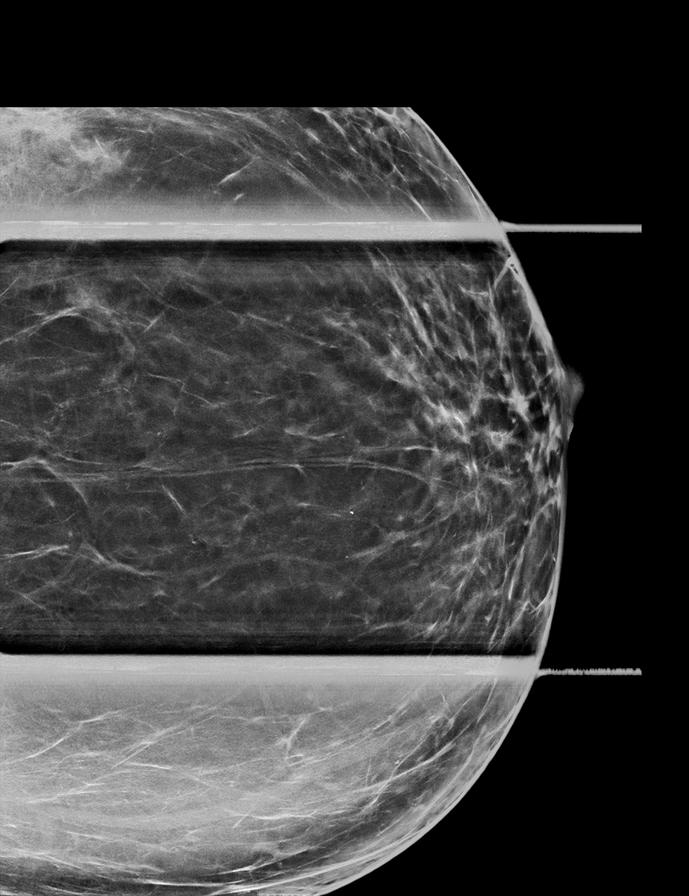

[L CC synth-2D (2 of 2)]
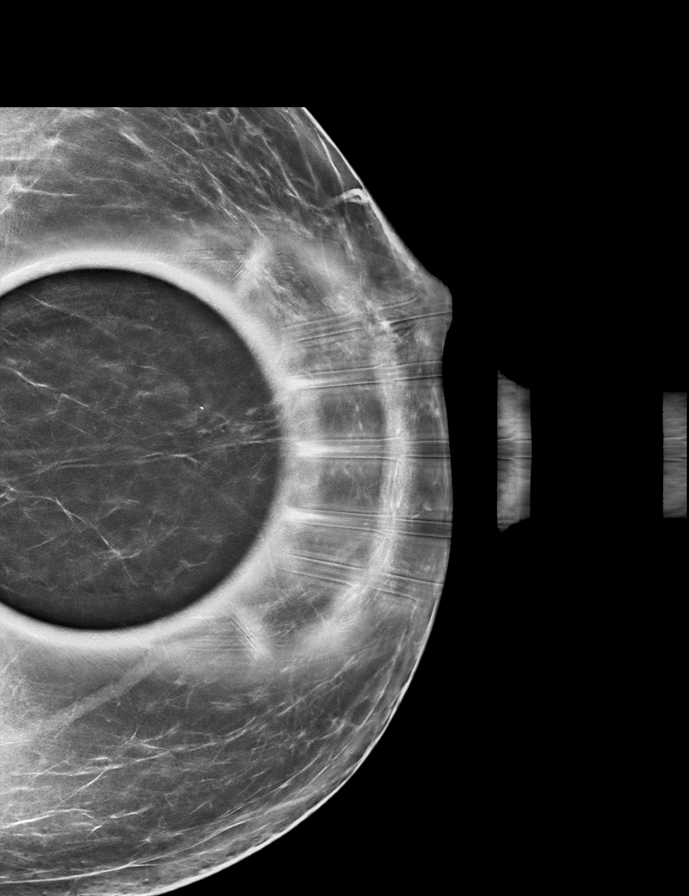

[L MLO synth-2D]
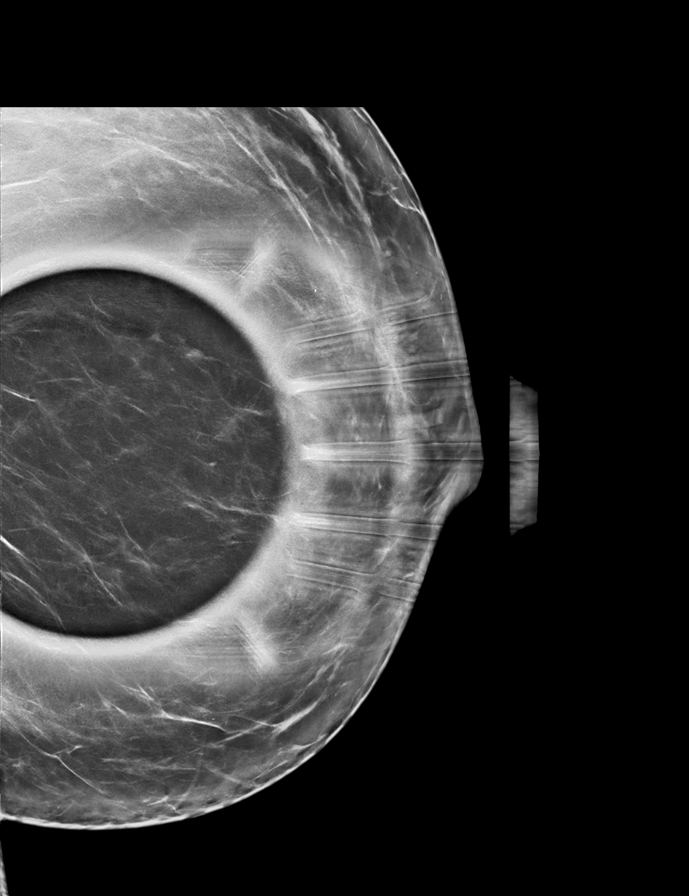

[L ML synth-2D]
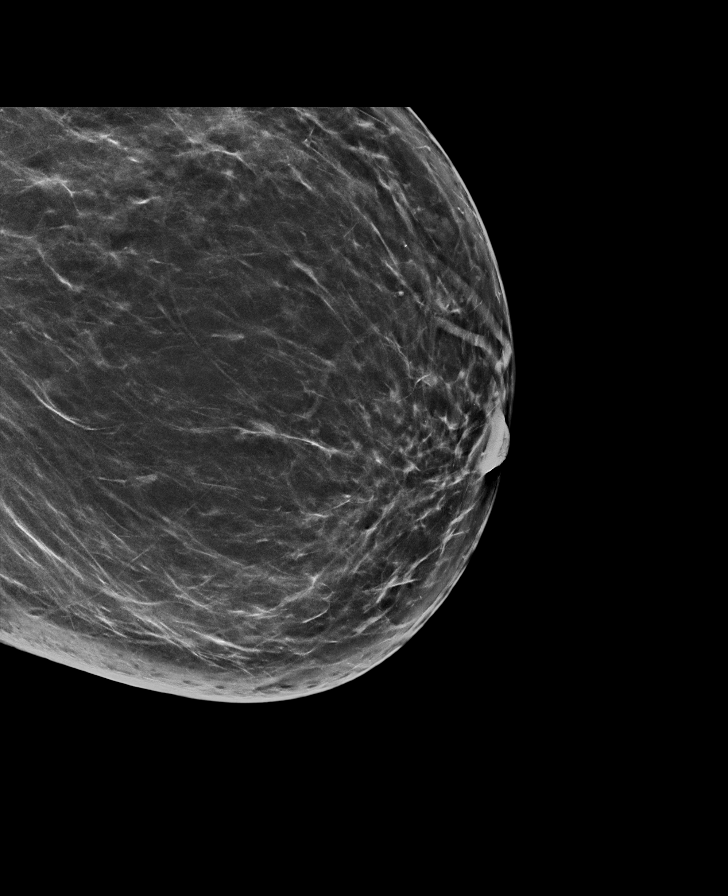

[L ML tomo · 2 of 91 frames shown]
[frame 30/91]
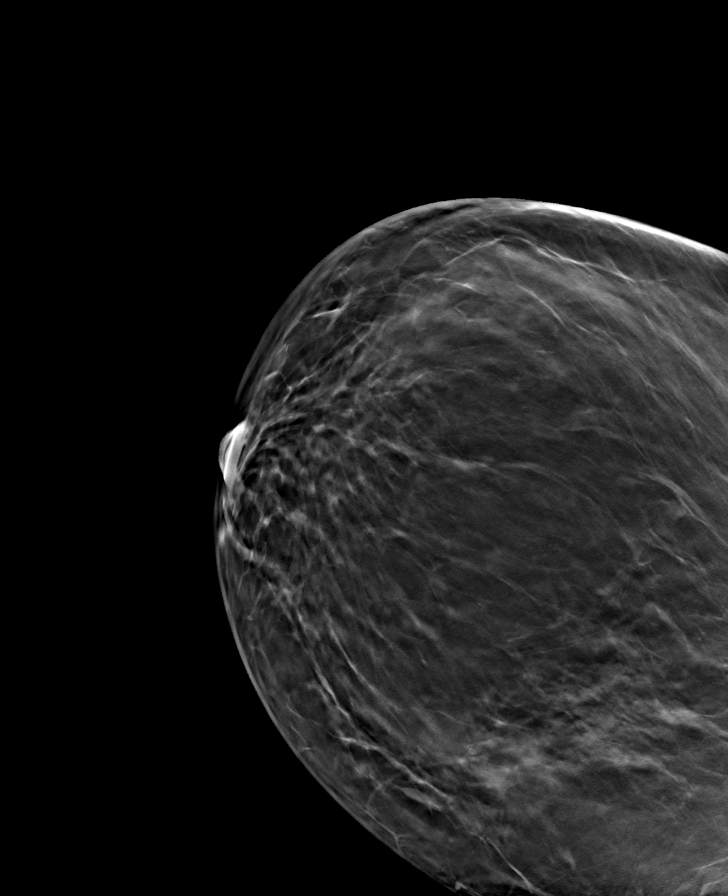
[frame 46/91]
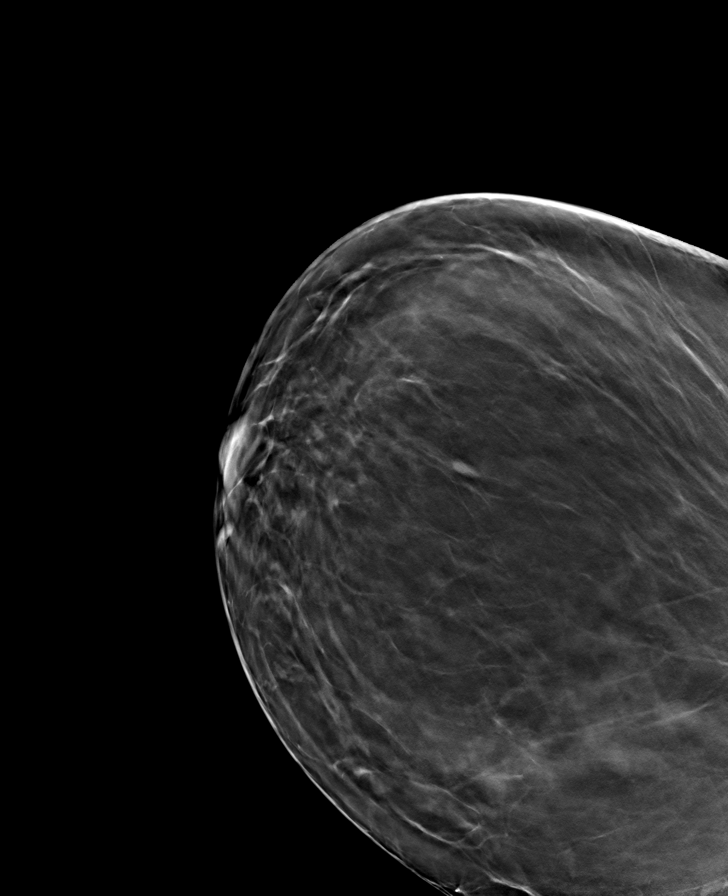

[L CC tomo (1 of 2) · tomo slice 37/74.0]
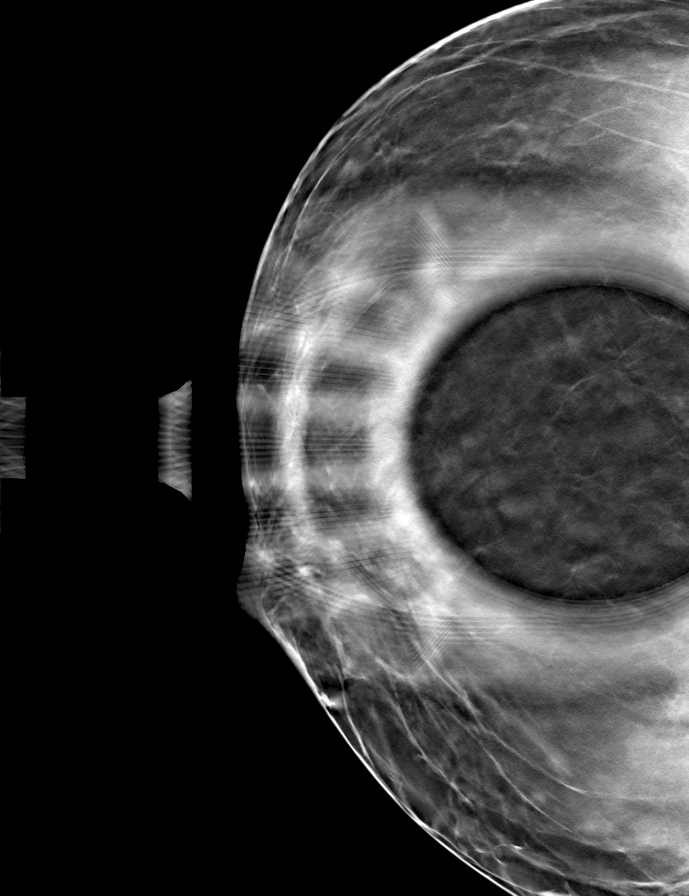

[L CC tomo (2 of 2) · tomo slice 40/79.0]
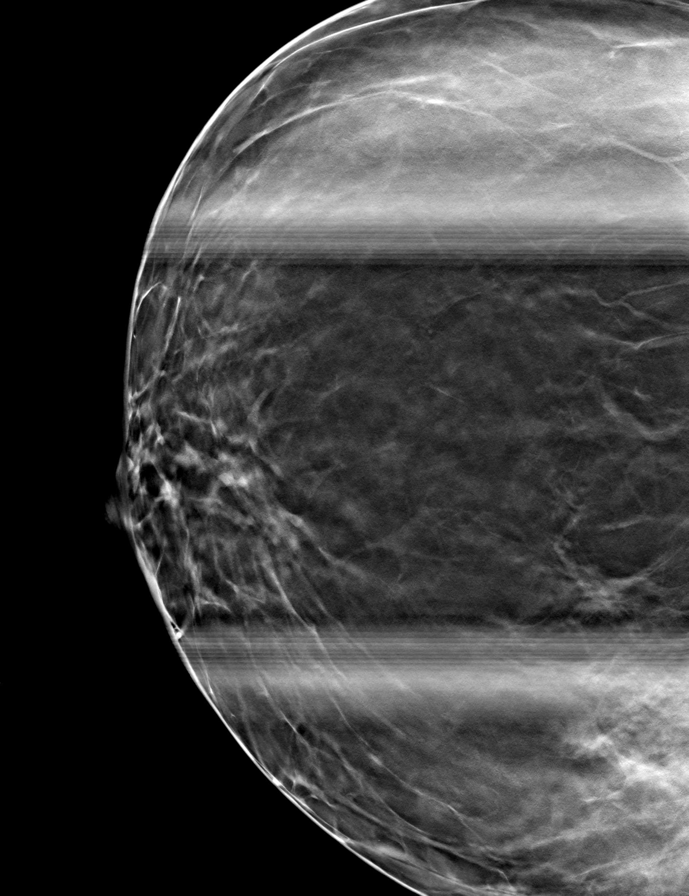

[L MLO tomo · tomo slice 43/86.0]
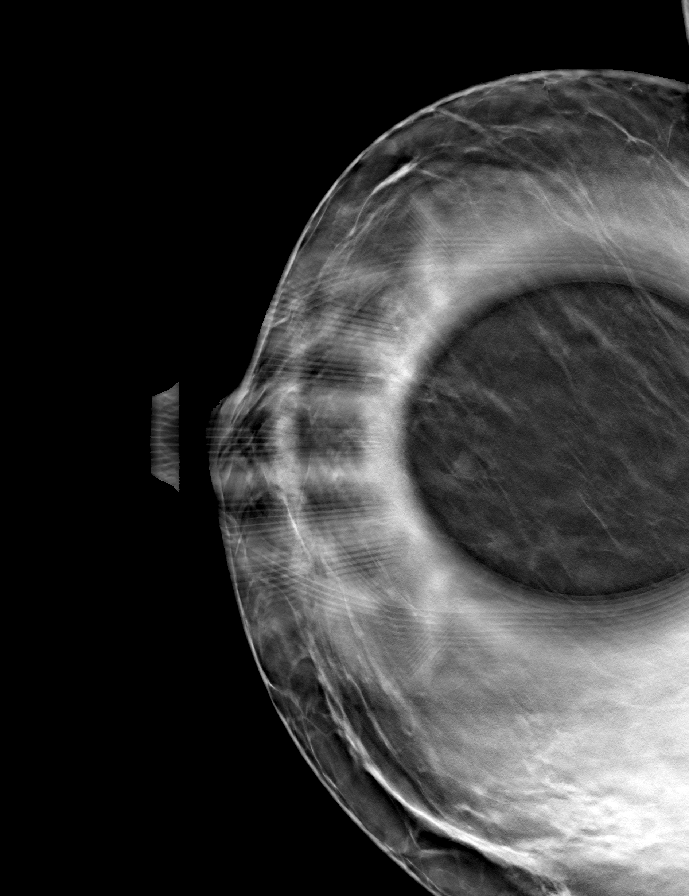

[9 of 24 positions shown; findings below may reference images not displayed]

ACR Breast Density Category b: There are scattered areas of
fibroglandular density.
FINDINGS: The mass in the slightly lateral inferior left breast persists on
additional imaging measuring between 6 and 7 mm.

On physical exam, no suspicious lumps are identified.

Targeted ultrasound is performed, showing a cluster of cysts in the
left breast at [DATE], 4 cm from the nipple correlating with the
mammographic finding. No evidence of malignancy.
IMPRESSION: Fibrocystic changes.  No evidence of malignancy.

RECOMMENDATION:
Annual screening mammography.

I have discussed the findings and recommendations with the patient.
If applicable, a reminder letter will be sent to the patient
regarding the next appointment.

BI-RADS CATEGORY  2: Benign.

## 2021-06-05 IMAGING — US US BREAST*L* LIMITED INC AXILLA
1 series · 4 of 4 positions shown · non-contrast
Comparison: Previous exam(s).

CLINICAL DATA: The patient was called back for a left breast mass.

EXAM:
DIGITAL DIAGNOSTIC LEFT MAMMOGRAM WITH TOMO
ULTRASOUND LEFT BREAST

[Series 1: us breast*left* limited inc axilla · 0.06mm/px · 4 of 4 slices shown]
[im 1/4]
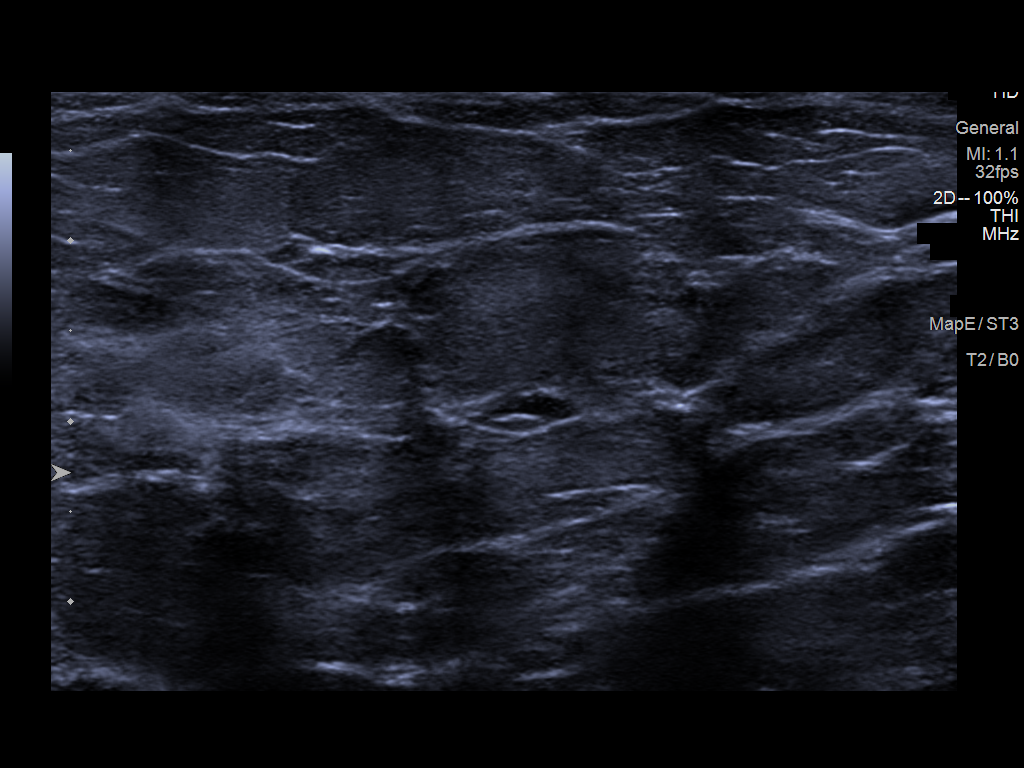
[im 2/4]
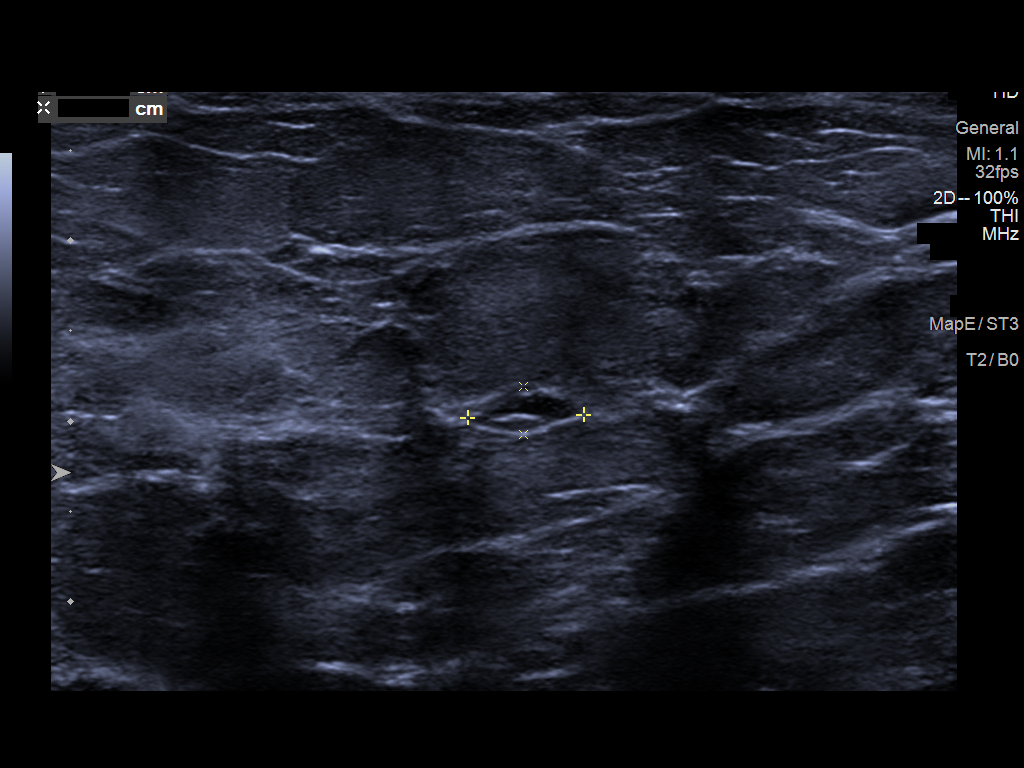
[im 3/4]
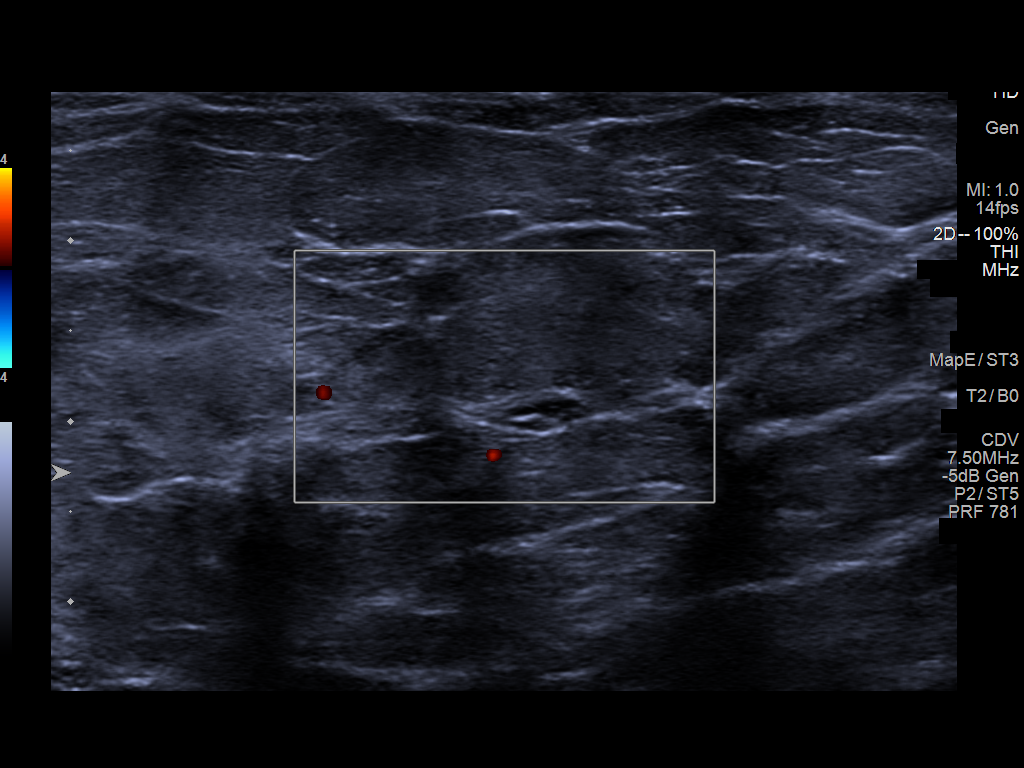
[im 4/4]
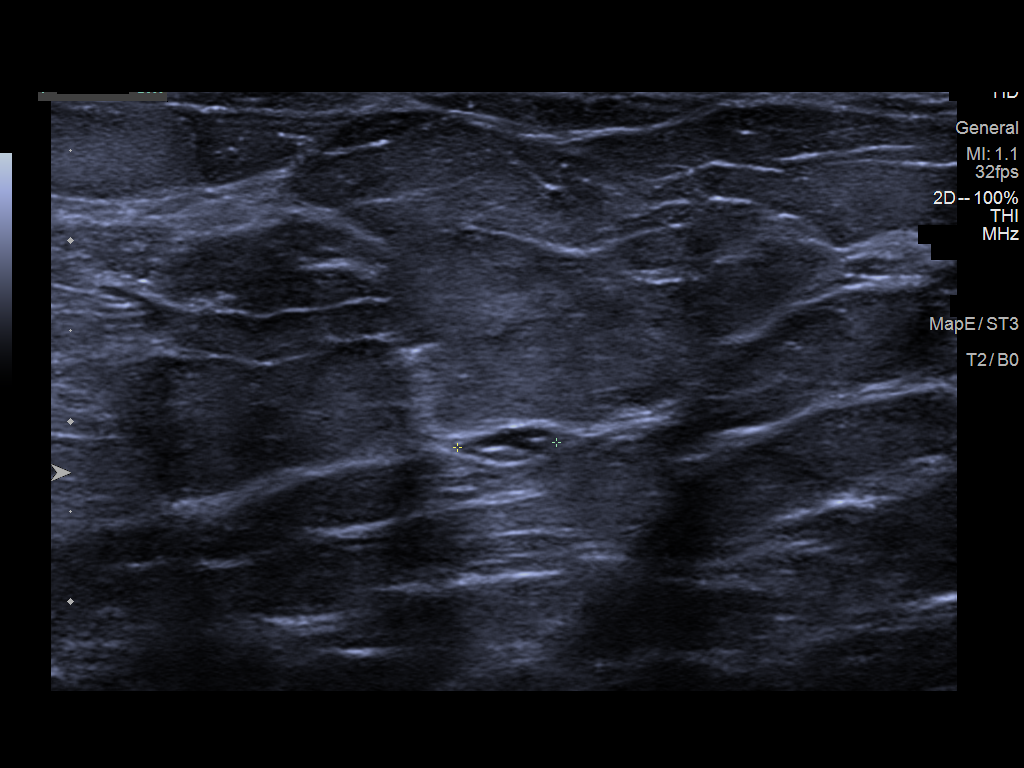

[4 of 4 positions shown; findings below may reference images not displayed]

ACR Breast Density Category b: There are scattered areas of
fibroglandular density.
FINDINGS: The mass in the slightly lateral inferior left breast persists on
additional imaging measuring between 6 and 7 mm.

On physical exam, no suspicious lumps are identified.

Targeted ultrasound is performed, showing a cluster of cysts in the
left breast at [DATE], 4 cm from the nipple correlating with the
mammographic finding. No evidence of malignancy.
IMPRESSION: Fibrocystic changes.  No evidence of malignancy.

RECOMMENDATION:
Annual screening mammography.

I have discussed the findings and recommendations with the patient.
If applicable, a reminder letter will be sent to the patient
regarding the next appointment.

BI-RADS CATEGORY  2: Benign.

## 2023-04-22 ENCOUNTER — Other Ambulatory Visit: Payer: Self-pay | Admitting: Family Medicine

## 2023-04-22 DIAGNOSIS — Z1231 Encounter for screening mammogram for malignant neoplasm of breast: Secondary | ICD-10-CM

## 2023-04-24 ENCOUNTER — Encounter (HOSPITAL_COMMUNITY): Payer: Self-pay | Admitting: NURSE PRACTITIONER

## 2023-04-24 ENCOUNTER — Inpatient Hospital Stay (HOSPITAL_COMMUNITY): Payer: BLUE CROSS/BLUE SHIELD

## 2023-04-24 ENCOUNTER — Emergency Department
Admission: EM | Admit: 2023-04-24 | Discharge: 2023-04-24 | Disposition: A | Discharge status: Home / Self Care | Payer: BLUE CROSS/BLUE SHIELD | Attending: NURSE PRACTITIONER | Admitting: NURSE PRACTITIONER

## 2023-04-24 ENCOUNTER — Other Ambulatory Visit: Payer: Self-pay

## 2023-04-24 DIAGNOSIS — S82832A Other fracture of upper and lower end of left fibula, initial encounter for closed fracture: Secondary | ICD-10-CM | POA: Insufficient documentation

## 2023-04-24 DIAGNOSIS — W109XXA Fall (on) (from) unspecified stairs and steps, initial encounter: Secondary | ICD-10-CM | POA: Insufficient documentation

## 2023-04-24 DIAGNOSIS — S82409A Unspecified fracture of shaft of unspecified fibula, initial encounter for closed fracture: Secondary | ICD-10-CM

## 2023-04-24 DIAGNOSIS — S9305XA Dislocation of left ankle joint, initial encounter: Secondary | ICD-10-CM

## 2023-04-24 DIAGNOSIS — Y9301 Activity, walking, marching and hiking: Secondary | ICD-10-CM

## 2023-04-24 DIAGNOSIS — S82432A Displaced oblique fracture of shaft of left fibula, initial encounter for closed fracture: Secondary | ICD-10-CM

## 2023-04-24 MED ORDER — HYDROCODONE 5 MG-ACETAMINOPHEN 325 MG TABLET
ORAL_TABLET | ORAL | Status: AC
Start: 2023-04-24 — End: 2023-04-24
  Filled 2023-04-24: qty 2

## 2023-04-24 MED ORDER — HYDROCODONE 5 MG-ACETAMINOPHEN 325 MG TABLET
2.0000 | ORAL_TABLET | ORAL | Status: AC
Start: 2023-04-24 — End: 2023-04-24
  Administered 2023-04-24: 2 via ORAL

## 2023-04-24 MED ORDER — HYDROCODONE 5 MG-ACETAMINOPHEN 325 MG TABLET
1.0000 | ORAL_TABLET | Freq: Four times a day (QID) | ORAL | 0 refills | Status: AC | PRN
Start: 2023-04-24 — End: 2023-04-27

## 2023-04-24 NOTE — ED Provider Notes (Signed)
[facility]  ED Primary Provider Note  Patient Name: Sabrina Nelson  Patient Age: 52 y.o.  Date of Birth: Aug 10, 1971    Chief Complaint: Ankle Pain        History of Present Illness       Sabrina Nelson is a 52 y.o. female who had concerns including Ankle Pain.  Patient presents to ED today for left ankle pain that occurred approximately an hour ago.  Patient was attempting to step down from the 2nd step to the 1st step when her ankle slipped and folded underneath her.  Tenderness upon palpation of the medial malleolus.  Patient currently rates pain 8/10        Review of Systems     No other overt Review of Systems are noted to be positive except noted in the HPI.      Historical Data   History Reviewed This Encounter: Medical History  Surgical History  Family History  Social History      Physical Exam   ED Triage Vitals [04/24/23 2103]   BP (Non-Invasive) 138/84   Heart Rate (!) 105   Respiratory Rate 19   Temperature 36.7 C (98 F)   SpO2 99 %   Weight 92.1 kg (203 lb)   Height 1.6 m (5\' 3" )         Nursing notes reviewed for what could be assessed. Past Medical, Surgical, and Social history reviewed for what has been completed.     Constitutional: NAD. Well-Developed. Well Nourished.  Head: Normocephalic, atraumatic.  Cardiovascular: Regular Rate and Rhythm, extremities well perfused.  Pulmonary/Chest: No respiratory distress. Lungs are symmetric to auscultation bilaterally.  Abdominal: Soft, non-tender, non-distended. Non peritoneal, no rebound, no guarding.  MSK: No Lower Extremity Edema.  Tenderness upon palpation of the medial malleolus.  CNS intact.  Capillary refill brisk.  Skin: Warm, dry, and intact  Neuro: Appropriate, CN II-XII grossly intact.   Psych: Pleasant           Procedures      Patient Data   Labs Ordered/Reviewed - No data to display    XR ANKLE LEFT   Final Result by Edi, Radresults In (05/04 2127)   There is a displaced oblique fracture through the  distal fibula with disruption of the ankle mortise.                   Radiologist location ID: [id number]             Medical Decision Making          Medical Decision Making  Amount and/or Complexity of Data Reviewed  Radiology: ordered.          Studies Assessed:  Imaging      MDM Narrative:  Patient presents to ED today after she fell from the 2nd step down to the 1st set.  Patient's leg folded underneath her.  CNS is intact.  Capillary refill is brisk.  Patient was tender upon palpation of the medial malleolus. Disk given to patient. Patient placed in a posterior mold splint as suggested by Tammy Sours. Cap refill brisk, pulses 2+. Patient given crutches and instructed on no weight bearing. Patient verbalized understanding and states she will follow up with her ortho in [place]. Patient discharged home.       ED Course as of 04/24/23 2219   Sat Apr 24, 2023   2141 Called and spoke with the Blair Hailey the [place] on-call [facility].  He instructed for patient to be placed in a posterior mold with lots of caught an and then follow up with ortho when they return home.         Medications Administered in the ED   HYDROcodone-acetaminophen (NORCO) 5-325 mg per tablet (2 Tablets Oral Given 04/24/23 2152)       Following the history, physical exam, and ED workup, the patient was deemed stable and suitable for discharge. The patient/caregiver was advised to return to the ED for any new or worsening symptoms. Discharge medications, and follow-up instructions were discussed with the patient/caregiver in detail, who verbalizes understanding. The patient/caregiver is in agreement and is comfortable with the plan of care.    Disposition: Discharged         Current Discharge Medication List        START taking these medications.        Details   HYDROcodone-acetaminophen 5-325 mg Tablet  Commonly known as: NORCO   1 Tablet, Oral, EVERY 6 HOURS PRN  Qty: 12 Tablet  Refills: 0            Follow up:   No follow-up  provider specified.             Clinical Impression   Fibula fracture (Primary)         Current Discharge Medication List        START taking these medications    Details   HYDROcodone-acetaminophen (NORCO) 5-325 mg Oral Tablet Take 1 Tablet by mouth Every 6 hours as needed for Pain for up to 3 days  Qty: 12 Tablet, Refills: 0                 Bunnie Domino FNP-C  Department of Emergency Medicine

## 2023-04-24 NOTE — ED Nurses Note (Signed)
Cast placed by Bunnie Domino APRN and pt care tech. Crutches given with instructions on use.

## 2023-04-24 NOTE — ED Nurses Note (Signed)
Discharged home, received written and verbal discharge instructions with understanding voiced. Made aware to follow up with PCP, ortho of choice. CD with images given. Made aware to return to er for any worsening symptoms. Leaving ER via wheelchair with significant other.

## 2023-04-24 NOTE — Discharge Instructions (Signed)

## 2023-04-24 NOTE — ED Triage Notes (Addendum)
Fell from 2nd step down to first step of stairs and her left leg folded underneath her. Ankle pain and swelling after fall. Strong left pedal pulse in triage. States she has had "some alcohol tonight".

## 2023-04-25 ENCOUNTER — Telehealth (HOSPITAL_COMMUNITY): Payer: Self-pay

## 2023-04-25 NOTE — ED Nurses Note (Signed)
First attempt to follow up with patient.

## 2023-04-27 ENCOUNTER — Other Ambulatory Visit: Payer: Self-pay | Admitting: Physician Assistant

## 2023-04-27 ENCOUNTER — Ambulatory Visit
Admission: RE | Admit: 2023-04-27 | Discharge: 2023-04-27 | Disposition: A | Discharge status: Home / Self Care | Payer: BC Managed Care – PPO | Source: Ambulatory Visit | Attending: Physician Assistant | Admitting: Physician Assistant

## 2023-04-27 DIAGNOSIS — S82842A Displaced bimalleolar fracture of left lower leg, initial encounter for closed fracture: Secondary | ICD-10-CM | POA: Insufficient documentation

## 2023-05-04 ENCOUNTER — Telehealth (HOSPITAL_COMMUNITY): Payer: Self-pay

## 2023-05-04 NOTE — ED Nurses Note (Signed)
Second attempt to follow up with patient.

## 2023-05-12 ENCOUNTER — Telehealth (HOSPITAL_COMMUNITY): Payer: Self-pay

## 2023-05-12 NOTE — ED Nurses Note (Signed)
Third attempt to follow up with patient.

## 2023-05-19 ENCOUNTER — Ambulatory Visit: Payer: BC Managed Care – PPO

## 2023-10-13 ENCOUNTER — Ambulatory Visit
Admission: RE | Admit: 2023-10-13 | Discharge: 2023-10-13 | Disposition: A | Discharge status: Home / Self Care | Payer: BC Managed Care – PPO | Source: Ambulatory Visit | Attending: Family Medicine | Admitting: Family Medicine

## 2023-10-13 DIAGNOSIS — Z1231 Encounter for screening mammogram for malignant neoplasm of breast: Secondary | ICD-10-CM | POA: Diagnosis present

## 2024-10-25 ENCOUNTER — Other Ambulatory Visit: Payer: Self-pay | Admitting: Family Medicine

## 2024-10-25 DIAGNOSIS — Z1231 Encounter for screening mammogram for malignant neoplasm of breast: Secondary | ICD-10-CM

## 2024-12-06 ENCOUNTER — Ambulatory Visit: Admission: RE | Admit: 2024-12-06 | Discharge: 2024-12-06 | Attending: Family Medicine | Admitting: Family Medicine

## 2024-12-06 DIAGNOSIS — Z1231 Encounter for screening mammogram for malignant neoplasm of breast: Secondary | ICD-10-CM | POA: Insufficient documentation
